# Patient Record
Sex: Female | Born: 1960 | Race: White | Hispanic: No | Marital: Married
Health system: Midwestern US, Community
[De-identification: ages and names within clinical notes are randomized; demographics above are authoritative.]

## PROBLEM LIST (undated history)

## (undated) DIAGNOSIS — C50112 Malignant neoplasm of central portion of left female breast: Principal | ICD-10-CM

## (undated) DIAGNOSIS — T451X5A Adverse effect of antineoplastic and immunosuppressive drugs, initial encounter: Secondary | ICD-10-CM

## (undated) DIAGNOSIS — C50012 Malignant neoplasm of nipple and areola, left female breast: Principal | ICD-10-CM

## (undated) DIAGNOSIS — R232 Flushing: Secondary | ICD-10-CM

---

## 2019-03-30 LAB — FECAL DNA COLORECTAL CANCER SCREENING (COLOGUARD): FIT-DNA (Cologuard): NEGATIVE

## 2019-04-05 LAB — LIPID PANEL
Chol/HDL Ratio: 3.2 (ref 0.0–4.4)
Cholesterol: 206 mg/dL — ABNORMAL HIGH (ref 100–200)
HDL: 64 mg/dL — ABNORMAL LOW (ref 65–96)
LDL Cholesterol: 97 mg/dL (ref 0.0–100.0)
LDL/HDL Ratio: 2
Triglycerides: 224 mg/dL — ABNORMAL HIGH (ref 0–149)
VLDL: 44.8 mg/dL — ABNORMAL HIGH (ref 5.0–40.0)

## 2019-04-05 LAB — MICROALBUMIN / CREATININE URINE RATIO
Creatinine, Ur: 57 mg/dL (ref 28.0–217.0)
Microalb, Ur: 0.5 mg/dL (ref 0.0–20.0)
Microalbumin Creatinine Ratio: 9 mg/g Cr (ref 0–30)

## 2019-04-05 LAB — TSH WITH REFLEX TO FT4: TSH: 16.6 mcIU/mL — ABNORMAL HIGH (ref 0.358–3.740)

## 2019-04-05 LAB — COMPREHENSIVE METABOLIC PANEL
ALT: 19 U/L (ref 0–33)
AST: 18 U/L (ref 0–32)
Albumin/Globulin Ratio: 2 mmol/L (ref 1.00–2.00)
Albumin: 4.6 g/dL (ref 3.5–5.2)
Alk Phosphatase: 90 U/L (ref 35–117)
Anion Gap: 11 mmol/L (ref 2–17)
BUN: 14 mg/dL (ref 6–20)
CO2: 27 mmol/L (ref 22–29)
Calcium: 9.5 mg/dL (ref 8.6–10.0)
Chloride: 102 mmol/L (ref 98–107)
Creatinine: 0.7 mg/dL (ref 0.5–0.9)
GFR African American: 111 mL/min/{1.73_m2} (ref >=90)
GFR Non-African American: 96 mL/min/{1.73_m2} (ref >=90)
Globulin: 3 g/dL (ref 1.9–4.4)
Glucose: 103 mg/dL — ABNORMAL HIGH (ref 70–99)
OSMOLALITY CALCULATED: 282 mOsm/kg (ref 270–287)
Potassium: 4.2 mmol/L (ref 3.5–5.3)
Sodium: 141 mmol/L (ref 135–145)
Total Bilirubin: 0.19 mg/dL (ref 0.00–1.20)
Total Protein: 7.2 g/dL (ref 6.4–8.3)

## 2019-04-05 LAB — HEMOGLOBIN A1C
Est. Avg. Glucose, WB: 120
Est. Avg. Glucose-calculated: 129
Hemoglobin A1C: 5.8 % (ref 4.0–6.0)

## 2019-04-05 LAB — CBC WITH AUTO DIFFERENTIAL
Absolute Baso #: 0 10*3/uL (ref 0.0–0.2)
Absolute Eos #: 0.2 10*3/uL (ref 0.0–0.5)
Absolute Lymph #: 1.8 10*3/uL (ref 1.0–3.2)
Absolute Mono #: 0.5 10*3/uL (ref 0.3–1.0)
Basophils %: 0.5 % (ref 0.0–2.0)
Eosinophils %: 4.1 % (ref 0.0–7.0)
Hematocrit: 41.4 % (ref 34.0–47.0)
Hemoglobin: 13.4 g/dL (ref 11.5–15.7)
Immature Grans (Abs): 0.03 10*3/uL (ref 0.00–0.06)
Immature Granulocytes: 0.5 % (ref 0.1–0.6)
Lymphocytes: 32.9 % (ref 15.0–45.0)
MCH: 30.5 pg (ref 27.0–34.5)
MCHC: 32.4 g/dL (ref 32.0–36.0)
MCV: 94.3 fL (ref 81.0–99.0)
MPV: 9.7 fL (ref 7.2–13.2)
Monocytes: 8.8 % (ref 4.0–12.0)
Neutrophils %: 53.2 % (ref 42.0–74.0)
Neutrophils Absolute: 3 10*3/uL (ref 1.6–7.3)
Platelets: 284 10*3/uL (ref 140–440)
RBC: 4.39 x10e6/mcL (ref 3.60–5.20)
RDW: 13.9 % (ref 11.0–16.0)
WBC: 5.6 10*3/uL (ref 3.8–10.6)

## 2019-04-05 LAB — T4, FREE: T4 Free: 1.1 ng/dL (ref 0.82–1.70)

## 2019-07-29 LAB — TSH WITH REFLEX TO FT4: TSH: 4.02 mcIU/mL — ABNORMAL HIGH (ref 0.358–3.740)

## 2019-07-29 LAB — T4, FREE: T4 Free: 1.48 ng/dL (ref 0.82–1.70)

## 2020-03-17 LAB — COMPREHENSIVE METABOLIC PANEL
ALT: 19 U/L (ref 0–33)
AST: 17 U/L (ref 0–32)
Albumin/Globulin Ratio: 2 mmol/L (ref 1.00–2.70)
Albumin: 4.9 g/dL (ref 3.5–5.2)
Alk Phosphatase: 95 U/L (ref 35–117)
Anion Gap: 10 mmol/L (ref 2–17)
BUN: 17 mg/dL (ref 6–20)
CO2: 27 mmol/L (ref 22–29)
Calcium: 9.4 mg/dL (ref 8.6–10.0)
Chloride: 101 mmol/L (ref 98–107)
Creatinine: 0.6 mg/dL (ref 0.5–1.0)
GFR African American: 116 mL/min/{1.73_m2} (ref >=90)
GFR Non-African American: 100 mL/min/{1.73_m2} (ref >=90)
Globulin: 2 g/dL (ref 1.9–4.4)
Glucose: 110 mg/dL — ABNORMAL HIGH (ref 70–99)
OSMOLALITY CALCULATED: 280 mOsm/kg (ref 270–287)
Potassium: 4.1 mmol/L (ref 3.5–5.3)
Sodium: 139 mmol/L (ref 135–145)
Total Bilirubin: 0.31 mg/dL (ref 0.00–1.20)
Total Protein: 7.4 g/dL (ref 6.4–8.3)

## 2020-03-17 LAB — CBC WITH AUTO DIFFERENTIAL
Absolute Baso #: 0 10*3/uL (ref 0.0–0.2)
Absolute Eos #: 0.2 10*3/uL (ref 0.0–0.5)
Absolute Lymph #: 2 10*3/uL (ref 1.0–3.2)
Absolute Mono #: 0.6 10*3/uL (ref 0.3–1.0)
Basophils %: 0.7 % (ref 0.0–2.0)
Eosinophils %: 3.7 % (ref 0.0–7.0)
Hematocrit: 43.2 % (ref 34.0–47.0)
Hemoglobin: 14.2 g/dL (ref 11.5–15.7)
Immature Grans (Abs): 0.01 10*3/uL (ref 0.00–0.06)
Immature Granulocytes: 0.2 % (ref 0.1–0.6)
Lymphocytes: 37.9 % (ref 15.0–45.0)
MCH: 31.1 pg (ref 27.0–34.5)
MCHC: 32.9 g/dL (ref 32.0–36.0)
MCV: 94.7 fL (ref 81.0–99.0)
MPV: 9.6 fL (ref 7.2–13.2)
Monocytes: 10.2 % (ref 4.0–12.0)
Neutrophils %: 47.3 % (ref 42.0–74.0)
Neutrophils Absolute: 2.5 10*3/uL (ref 1.6–7.3)
Platelets: 288 10*3/uL (ref 140–440)
RBC: 4.56 x10e6/mcL (ref 3.60–5.20)
RDW: 12.8 % (ref 11.0–16.0)
WBC: 5.4 10*3/uL (ref 3.8–10.6)

## 2020-03-17 LAB — LIPID PANEL
Chol/HDL Ratio: 3.3 (ref 0.0–4.4)
Cholesterol: 186 mg/dL (ref 100–200)
HDL: 56 mg/dL (ref >=50)
LDL Cholesterol: 93 mg/dL (ref 0.0–100.0)
LDL/HDL Ratio: 2
Triglycerides: 186 mg/dL — ABNORMAL HIGH (ref 0–149)
VLDL: 37.2 mg/dL (ref 5.0–40.0)

## 2020-03-17 LAB — TSH WITH REFLEX TO FT4: TSH: 1.22 mcIU/mL (ref 0.358–3.740)

## 2020-03-17 LAB — HEMOGLOBIN A1C
Est. Avg. Glucose, WB: 120
Est. Avg. Glucose-calculated: 129
Hemoglobin A1C: 5.8 % (ref 4.0–6.0)

## 2020-04-08 NOTE — Procedures (Signed)
Mammo procedure [facility]        Patient:   Robin Bell, Robin Bell             MRN: [id number]            FIN: [id number]               Age:   59 years     Sex:  Female     DOB:  [birth year]   Associated Diagnoses:   None   Author:   Carlena Bjornstad ANNE-MD          Imaging procedure and order verification safety checklist completed: Yes  Allergies documented and available in allergy section of the EMR.    Procedure performed:  Site:   Breast: Right  xLeft   [ ]  Cyst aspiration   [ ]  Wire localization   [ x] US biopsy    [ ]  Stereotactic biopsy   [ ]  MRI biopsy    Procedure and Findings:   [x ] Mass   [ ]  Calcification   [ ]  Clip   [ ]  Other    Puncture Site:   [ ]  Medial   [ x] Lateral   [ ]  Superior   [ ]  Inferior    Contrast:   [ x] None.     [ ]  Other    Medications:   [ x]   15 cc 1% Lidocaine SQ for local   [ ]  with epinephrine    Specimens Removed: None unless noted,   [x ] Core samples   [ ]  Cyst fluid   [ ]  Other    Estimated Blood Loss: None unless noted.    Postoperative Diagnosis:   [ ]  Cyst aspiration performed   [ ]  Stereotactic biopsy performed   [ ]  Wire localization performed   [ x] US biopsy performed   [ ]  MRI biopsy performed    Disposition:   [ x] Home   [ ]  Surgery department   [ ]  Nurse holding   [ ]  Other:    Comments: None unless noted.      Signature Line     Electronically Signed on 04/08/2020 11:08 AM EDT   ________________________________________________   Carlena Bjornstad ANNE-MD

## 2020-04-28 LAB — COMPREHENSIVE METABOLIC PANEL
ALT: 18 U/L (ref 0–33)
AST: 19 U/L (ref 0–32)
Albumin/Globulin Ratio: 2 mmol/L (ref 1.00–2.70)
Albumin: 4.5 g/dL (ref 3.5–5.2)
Alk Phosphatase: 81 U/L (ref 35–117)
Anion Gap: 12 mmol/L (ref 2–17)
BUN: 16 mg/dL (ref 6–20)
CO2: 27 mmol/L (ref 22–29)
Calcium: 9.4 mg/dL (ref 8.6–10.0)
Chloride: 104 mmol/L (ref 98–107)
Creatinine: 0.7 mg/dL (ref 0.5–1.0)
GFR African American: 111 mL/min/{1.73_m2} (ref >=90)
GFR Non-African American: 96 mL/min/{1.73_m2} (ref >=90)
Globulin: 2 g/dL (ref 1.9–4.4)
Glucose: 105 mg/dL — ABNORMAL HIGH (ref 70–99)
OSMOLALITY CALCULATED: 287 mOsm/kg (ref 270–287)
Potassium: 4 mmol/L (ref 3.5–5.3)
Sodium: 143 mmol/L (ref 135–145)
Total Bilirubin: 0.2 mg/dL (ref 0.00–1.20)
Total Protein: 6.7 g/dL (ref 6.4–8.3)

## 2020-05-08 NOTE — Procedures (Signed)
Breast Imaging - Left Ultrasound Biopsy        Patient:   Robin Bell, Robin Bell             MRN: [id number]            FIN: [id number]               Age:   59 years     Sex:  Female     DOB:  1960/11/19   Associated Diagnoses:   None   Author:   Lindell Noe          Imaging procedure and order verification safety checklist completed: Yes  Allergies documented and available in allergy section of the EMR.    Procedure performed:   Left axilla ultrasound guided biopsy    Target:   [x]  Node   [ ]  Cyst    Puncture Site:   [ ]  Medial   [ ]  Lateral   [x]  Superior   [ ]  Inferior    Medications:   [x]  1% Lidocaine subcutaneous   [x]  1% Lidocaine with epinephrine    Specimens:   [x]  Core samples   [ ]  Cyst fluid    Estimated Blood Loss:   None.    Postoperative Diagnosis:   [ ]  Cyst aspiration performed   [x]  [place] core biopsy performed      Disposition:  Home    Comments: Savi placed.      Signature Line     Electronically Signed on 05/08/2020 02:30 PM EDT   ________________________________________________   Lovett Sox, MD, Nada Boozer

## 2020-05-15 NOTE — Nursing Note (Signed)
Adult Admission Assessment - Text       Perioperative Admission Assessment Entered On:  05/15/2020 10:05 EDT    Performed On:  05/15/2020 9:47 EDT by Mendel Ryder, RN, [place]   Call Start :   05/15/2020 9:47 EDT   Mendel Ryder, RN, [place] - 05/15/2020 9:47 EDT   Call Complete :   05/15/2020 10:13 EDT   Mendel Ryder, RN, [place]AC[place] - 05/15/2020 10:12 EDT     Information Given By :   Self   Height/Length Estimated :   162.6 cm(Converted to: 64.02 in)    Weight   Estimated :   91.81 kg(Converted to: 202.406 lb)    Body Mass Index Estimated :   34.73 kg/m2   PAT Patient Procedure Verification :   Patient name and DOB confirmed with patient, Correct procedure scheduled confirmed with patient, Correct side/site confirmed with patient   Primary Care Physician/Specialists :   DR. Carola Rhine   Day of Proc Supp Prsn is the Emerg Cont :   Yes   Day of Procedure Support Person Name :   Wyatt Mage   Day of Procedure Support Person Phone :   [phone]   Day of Procedure Support Person Relationship :   SPOUSE   Languages :   Morrie Sheldon, RN, [place] - 05/15/2020 9:47 EDT   Allergies   (As Of: 05/15/2020 10:13:38 EDT)   Allergies (Active)   Adhesive Bandage  Estimated Onset Date:   Unspecified ; Reactions:   IRRITATES SKIN ; Created By:   Mendel Ryder RN, Hardie Pulley; Reaction Status:   Active ; Category:   Drug ; Substance:   Adhesive Bandage ; Type:   Allergy ; Severity:   Moderate ; Updated By:   Kem Boroughs; Reviewed Date:   05/15/2020 10:13 EDT      penicillins  Estimated Onset Date:   Unspecified ; Reactions:   RASH, SWELLING ; Created By:   Mendel Ryder RN, STACI; Reaction Status:   Active ; Category:   Drug ; Substance:   penicillins ; Type:   Allergy ; Severity:   Moderate ; Updated By:   Kem Boroughs; Reviewed Date:   05/15/2020 10:13 EDT        Medication History   Medication List   (As Of: 05/15/2020 10:13:38 EDT)   Home Meds    levothyroxine  :   levothyroxine ; Status:   Documented ; Ordered As Mnemonic:   Synthroid 125  mcg (0.125 mg) oral tablet ; Simple Display Line:   125 mcg, 1 tabs, Oral, Daily, 0 Refill(s) ; Catalog Code:   levothyroxine ; Order Dt/Tm:   05/15/2020 09:50:46 EDT          rosuvastatin  :   rosuvastatin ; Status:   Documented ; Ordered As Mnemonic:   rosuvastatin 10 mg oral tablet ; Simple Display Line:   10 mg, 1 tabs, Oral, Daily, 0 Refill(s) ; Catalog Code:   rosuvastatin ; Order Dt/Tm:   05/15/2020 09:50:46 EDT          losartan  :   losartan ; Status:   Documented ; Ordered As Mnemonic:   losartan 25 mg oral tablet ; Simple Display Line:   25 mg, 1 tabs, Oral, Daily, 30 tabs, 0 Refill(s) ; Catalog Code:   losartan ; Order Dt/Tm:   05/15/2020 09:50:46 EDT          propranolol  :  propranolol ; Status:   Documented ; Ordered As Mnemonic:   propranolol 60 mg oral capsule, extended release ; Simple Display Line:   60 mg, 1 cap, Oral, Daily, 30 cap, 0 Refill(s) ; Catalog Code:   propranolol ; Order Dt/Tm:   05/15/2020 09:50:46 EDT          acetaminophen  :   acetaminophen ; Status:   Documented ; Ordered As Mnemonic:   Tylenol ; Simple Display Line:   325 mg, Oral, q4hr, PRN: mild pain (1-3), 0 Refill(s) ; Catalog Code:   acetaminophen ; Order Dt/Tm:   05/15/2020 09:50:28 EDT            Problem History   (As Of: 05/15/2020 10:05:19 EDT)   Problems(Active)    Gloriann Loan palsy (SNOMED CT  :735329924 )  Name of Problem:   Bell palsy ; Onset Date:   46 ; Recorder:   LINDSEY, RN, [place]; Confirmation:   Confirmed ; Classification:   Patient Stated ; Code:   [id number] ; Contributor System:   Conservation officer, nature ; Last Updated:   05/15/2020 9:51 EDT ; Life Cycle Status:   Active ; Vocabulary:   SNOMED CT   ; Comments:        05/15/2020 9:51 - Mendel Ryder, RN, STACI  AND AGAIN IN 2015      Hypertension (SNOMED CT  :2297989211 )  Name of Problem:   Hypertension ; Recorder:   Mendel Ryder, RN, [place]; Confirmation:   Confirmed ; Classification:   Patient Stated ; Code:   [id number] ; Contributor System:   Conservation officer, nature ; Last Updated:   05/15/2020 9:52 EDT  ; Life Cycle Date:   05/15/2020 ; Life Cycle Status:   Active ; Vocabulary:   SNOMED CT        Hypothyroid (SNOMED CT  :81856314 )  Name of Problem:   Hypothyroid ; Recorder:   Mendel Ryder, RN, [place]; Confirmation:   Confirmed ; Classification:   Patient Stated ; Code:   [id number] ; Contributor System:   Conservation officer, nature ; Last Updated:   05/15/2020 9:51 EDT ; Life Cycle Date:   05/15/2020 ; Life Cycle Status:   Active ; Vocabulary:   SNOMED CT        Malignant brain tumor (SNOMED CT  :5885027741 )  Name of Problem:   Malignant brain tumor ; Onset Date:   69 ; Recorder:   LINDSEY, RN, [place]; Confirmation:   Confirmed ; Classification:   Patient Stated ; Code:   [id number] ; Contributor System:   PowerChart ; Last Updated:   05/15/2020 9:52 EDT ; Life Cycle Status:   Active ; Vocabulary:   SNOMED CT        Menopause (SNOMED CT  :947096283 )  Name of Problem:   Menopause ; Recorder:   LINDSEY, RN, [place]; Confirmation:   Confirmed ; Classification:   Patient Stated ; Code:   [id number] ; Contributor System:   Conservation officer, nature ; Last Updated:   05/15/2020 10:04 EDT ; Life Cycle Date:   05/15/2020 ; Life Cycle Status:   Active ; Vocabulary:   SNOMED CT        Prediabetes (SNOMED CT  :6503546568 )  Name of Problem:   Prediabetes ; Recorder:   Mendel Ryder, RN, [place]; Confirmation:   Confirmed ; Classification:   Patient Stated ; Code:   [id number] ; Contributor System:   PowerChart ; Last Updated:   05/15/2020 9:51 EDT ; Life Cycle Date:   05/15/2020 ; Life Cycle Status:   Active ;  Vocabulary:   SNOMED CT        Squamous cell carcinoma, 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 )  Name of Problem:   Squamous cell carcinoma, face ; Recorder:   Mendel Ryder, RN, [place]; Confirmation:   Confirmed ; Classification:   Patient Stated ; Code:   [id number] ; Contributor System:   PowerChart ; Last Updated:   05/15/2020 10:03 EDT ; Life Cycle Status:   Active ; Vocabulary:   SNOMED CT   ; Comments:        05/15/2020 10:03 - Mendel Ryder, RN, [place]  FRONT OF NECK        Procedure  History        -    Procedure History   (As Of: 05/15/2020 10:05:19 EDT)     Procedure Dt/Tm:   3086 ; Anesthesia Minutes:   0 ; Procedure Name:   Lumpectomy of right breast ; Procedure Minutes:   0 ; Last Reviewed Dt/Tm:   05/15/2020 10:04:16 EDT            Procedure Dt/Tm:   5784 ; Anesthesia Minutes:   0 ; Procedure Name:   Craniotomy ; Procedure Minutes:   0 ; Last Reviewed Dt/Tm:   05/15/2020 10:04:16 EDT            Anesthesia/Sedation   Anesthesia History :   Prior general anesthesia   SN - Malignant Hyperthermia :   Denies   Previous Problem with Anesthesia :   None   Moderate Sedation History :   No prior sedation for procedure   Symptoms of Sleep Apnea :   Age greater than 50, Hypertension   Symptoms of Sleep Apnea Score :   2    LINDSEY, RN, [place] - 05/15/2020 9:47 EDT   Bloodless Medicine   Is Blood Transfusion Acceptable to Patient :   Yes   LINDSEY, RN, [place] - 05/15/2020 9:47 EDT   ID Risk Screen Symptoms   Recent Travel History :   No recent travel   Close Contact with COVID-19 ID :   Preadmission testing patients only   Last 14 days COVID-19 ID :   No   TB Symptom Screen :   No symptoms   C. diff Symptom/History ID :   Neither of the above   LINDSEY, RN, [place] - 05/15/2020 9:47 EDT   ID COVID-19 Screen   Fever OR Chills :   No   Headache :   No   New or Worsening Cough :   No   Fatigue :   No   Shortness of Breath ID :   No   Myalgia (Muscle Pain) :   No   Dyspnea :   No   Diarrhea :   No   Sore Throat :   No   Nausea :   No   Laryngitis :   No   Sudden Loss of Taste or Smell :   No   Mendel Ryder, RN, [place] - 05/15/2020 9:47 EDT   Social History   Social History   (As Of: 05/15/2020 10:05:19 EDT)   Tobacco:        Tobacco use: Former smoker, quit more than 30 days ago.   Comments:  05/15/2020 9:54 - Mendel Ryder, RN, [place]: STOPPED 2005   (Last Updated: 05/15/2020 09:54:36 EDT by Mendel Ryder, RN, [place])          Electronic Cigarette/Vaping:        Never Electronic Cigarette Use.   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

## 2020-05-16 LAB — COVID-19 (COBAS): SARS-CoV-2, NAA: NEGATIVE

## 2020-05-19 NOTE — Nursing Note (Signed)
Adult Admission Assessment - Text       Perioperative Admission Assessment Entered On:  05/19/2020 12:46 EST    Performed On:  05/19/2020 12:38 EST by [place],  [place]   Call Start :   05/15/2020 9:47 EDT   Call Complete :   05/15/2020 10:13 EDT   Information Given By :   Self   Height/Length Estimated :   162.6 cm(Converted to: 64.02 in)    Weight   Estimated :   91.81 kg(Converted to: 202.406 lb)    Body Mass Index Estimated :   34.73 kg/m2   Primary Care Physician/Specialists :   DR. Carola Rhine   Day of Proc Supp Prsn is the Emerg Cont :   Yes   Day of Procedure Support Person Name :   Clair Gulling   Day of Procedure Support Person Phone :   [phone]   Day of Procedure Support Person Relationship :   husand   Languages :   Seabron Spates - 05/19/2020 12:38 EST   Allergies   (As Of: 05/19/2020 12:46:23 EST)   Allergies (Active)   Adhesive Bandage  Estimated Onset Date:   Unspecified ; Reactions:   IRRITATES SKIN ; Created By:   Mendel Ryder RN, Hardie Pulley; Reaction Status:   Active ; Category:   Drug ; Substance:   Adhesive Bandage ; Type:   Allergy ; Severity:   Moderate ; Updated By:   Kem Boroughs; Reviewed Date:   05/19/2020 12:39 EST      penicillins  Estimated Onset Date:   Unspecified ; Reactions:   RASH, SWELLING ; Created By:   Mendel Ryder RN, STACI; Reaction Status:   Active ; Category:   Drug ; Substance:   penicillins ; Type:   Allergy ; Severity:   Moderate ; Updated By:   Kem Boroughs; Reviewed Date:   05/19/2020 12:39 EST        Medication History   Medication List   (As Of: 05/19/2020 12:46:23 EST)   Normal Order    Lactated Ringers Injection solution 1,000 mL  :   Lactated Ringers Injection solution 1,000 mL ; Status:   Ordered ; Ordered As Mnemonic:   Lactated Ringers Injection 1,000 mL ; Simple Display Line:   10 mL/hr, IV ; Ordering Provider:   Evon Slack A-MD; Catalog Code:   Lactated Ringers Injection ; Order Dt/Tm:   05/19/2020 12:18:00 EST ; Comment:    Perioperative use ONLY  For Non Dialysis Patient          sodium chloride 0.9% Inj Soln 10 mL syringe  :   sodium chloride 0.9% Inj Soln 10 mL syringe ; Status:   Ordered ; Ordered As Mnemonic:   sodium chloride 0.9% flush syringe range dose ; Simple Display Line:   30 mL, IV Push, q8hr ; Ordering Provider:   Marvene Staff C-MD; Catalog Code:   sodium chloride flush ; Order Dt/Tm:   05/19/2020 12:18:56 EST          NP TC-62M FILT COLLOID PER DOSE  :   NP TC-62M FILT COLLOID PER DOSE ; Status:   Completed ; Ordered As Mnemonic:   NP TC-62M FILT COLLOID PER DOSE ; Simple Display Line:   540 EA, Subcutaneous, Once ; Ordering Provider:   Michelene Gardener JR-MD; Catalog Code:   NP TC-62M FILT COLLOID PER DOSE ;  Order Dt/Tm:   05/19/2020 12:32:22 EST          A Patient Specific Medication  :   A Patient Specific Medication ; Status:   Ordered ; Ordered As Mnemonic:   A Patient Specific Medication ; Simple Display Line:   1 EA, Kit-Combo, q63mn, PRN: other (see comment) ; Ordering Provider:   HMarvene StaffC-MD; Catalog Code:   A Patient Specific Medication ; Order Dt/Tm:   05/19/2020 12:18:56 EST          A Patient Specific Refrigerated Medication  :   A Patient Specific Refrigerated Medication ; Status:   Ordered ; Ordered As Mnemonic:   A Patient Specific Refrigerated Medication ; Simple Display Line:   1 EA, Kit-Combo, q551m, PRN: other (see comment) ; Ordering Provider:   HEMarvene Staff-MD; Catalog Code:   A Patient Specific Refrigerated Medicati ; Order Dt/Tm:   05/19/2020 12:18:56 EST ; Comment:   to access the patient specific Refrigerated medications          clindamycin in D5W  :   clindamycin in D5W ; Status:   Ordered ; Ordered As Mnemonic:   clindamycin IVPB ; Simple Display Line:   900 mg, 50 mL, 100 mL/hr, IV Piggyback, On Call ; Ordering Provider:   ALEvon Slack-MD; Catalog Code:   clindamycin ; Order Dt/Tm:   05/19/2020 12:18:00 EST          Delivery and Return Bin Access  :   Delivery and Return [employer] ; Status:   Ordered ; Ordered As Mnemonic:   Delivery and Return [employer] Simple Display Line:   1 EA, Kit-Combo, q5m58m PRN: other (see comment) ; Ordering Provider:   HEBMarvene StaffMD; Catalog Code:   Delivery and Return Bin Access ; Order Dt/Tm:   05/19/2020 12:18:56 EST ; Comment:   This code grants access to the [employer] the Delivery and Return Bin Access          lidocaine 1% PF Inj Soln 2 mL  :   lidocaine 1% PF Inj Soln 2 mL ; Status:   Ordered ; Ordered As Mnemonic:   lidocaine 1% preservative-free injectable solution ; Simple Display Line:   0.25 mL, ID, q5mi11mPRN: other (see comment) ; Ordering Provider:   HEBBMarvene StaffD; Catalog Code:   lidocaine ; Order Dt/Tm:   05/19/2020 12:18:56 EST ; Comment:   to access lidocaine 1%  2 mL vial for IV start and Life Port access          Respiratory MDI Treatment  :   Respiratory MDI Treatment ; Status:   Ordered ; Ordered As Mnemonic:   Respiratory MDI Treatment ; Simple Display Line:   1 EA, Kit-Combo, q5min81mRN: other (see comment) ; Ordering Provider:   HEBBAMarvene Staff; Catalog Code:   Respiratory MDI Treatment ; Order Dt/Tm:   05/19/2020 12:18:56 EST          sodium chloride 0.9% Inj Soln 10 mL syringe  :   sodium chloride 0.9% Inj Soln 10 mL syringe ; Status:   Ordered ; Ordered As Mnemonic:   sodium chloride 0.9% flush syringe range dose ; Simple Display Line:   30 mL, IV Push, q5min,80mN: other (see comment) ; Ordering Provider:   HEBBARMarvene Staff Catalog Code:   sodium chloride flush ; Order Dt/Tm:   05/19/2020 12:18:56 EST  sodium chloride 0.9% Inj Soln 10 mL vial PF  :   sodium chloride 0.9% Inj Soln 10 mL vial PF ; Status:   Ordered ; Ordered As Mnemonic:   sodium chloride 0.9% vial for reconstitution range dose ; Simple Display Line:   30 mL, IV Push, q56mn, PRN: other (see comment) ; Ordering Provider:   HMarvene StaffC-MD; Catalog Code:   sodium chloride flush ; Order Dt/Tm:   05/19/2020 12:18:56  EST ; Comment:   for access to sodium chloride 0.9% vial when needed as a diluent for reconstitutable medications          sterile water Inj Soln 10 mL  :   sterile water Inj Soln 10 mL ; Status:   Ordered ; Ordered As Mnemonic:   sterile water for reconstitution ; Simple Display Line:   10 mL, N/A, q522m, PRN: other (see comment) ; Ordering Provider:   HEMarvene Staff-MD; Catalog Code:   sterile water for reconstitution ; Order Dt/Tm:   05/19/2020 12:18:56 EST ; Comment:   Access sterile water when needed as a diluent for reconstitutable medications. Not for IV use.            Home Meds    acetaminophen  :   acetaminophen ; Status:   Documented ; Ordered As Mnemonic:   Tylenol ; Simple Display Line:   325 mg, Oral, q4hr, PRN: mild pain (1-3), 0 Refill(s) ; Catalog Code:   acetaminophen ; Order Dt/Tm:   05/15/2020 09:50:28 EDT          levothyroxine  :   levothyroxine ; Status:   Documented ; Ordered As Mnemonic:   Synthroid 125 mcg (0.125 mg) oral tablet ; Simple Display Line:   125 mcg, 1 tabs, Oral, Daily, 0 Refill(s) ; Catalog Code:   levothyroxine ; Order Dt/Tm:   05/15/2020 09:50:46 EDT          losartan  :   losartan ; Status:   Documented ; Ordered As Mnemonic:   losartan 25 mg oral tablet ; Simple Display Line:   25 mg, 1 tabs, Oral, Daily, 30 tabs, 0 Refill(s) ; Catalog Code:   losartan ; Order Dt/Tm:   05/15/2020 09:50:46 EDT          propranolol  :   propranolol ; Status:   Documented ; Ordered As Mnemonic:   propranolol 60 mg oral capsule, extended release ; Simple Display Line:   60 mg, 1 cap, Oral, Daily, 30 cap, 0 Refill(s) ; Catalog Code:   propranolol ; Order Dt/Tm:   05/15/2020 09:50:46 EDT          rosuvastatin  :   rosuvastatin ; Status:   Documented ; Ordered As Mnemonic:   rosuvastatin 10 mg oral tablet ; Simple Display Line:   10 mg, 1 tabs, Oral, Daily, 0 Refill(s) ; Catalog Code:   rosuvastatin ; Order Dt/Tm:   05/15/2020 09:50:46 EDT            Problem History   (As Of: 05/19/2020 12:46:23 EST)    Problems(Active)    BeGloriann Loanalsy (SNOMED CT  :29540086761  Name of Problem:   Bell palsy ; Onset Date:   1972 Recorder:   LINDSEY, RN, [place]an[place]ation:   Confirmed ; Classification:   Patient Stated ; Code:   [id number] Contributor System:   PoConservation officer, nature Last Updated:   05/15/2020 9:51 EDT ; Life Cycle Status:   Active ; Vocabulary:  SNOMED CT   ; Comments:        05/15/2020 9:51 - Mendel Ryder, RN, STACI  AND AGAIN IN 2015      Hypertension (SNOMED CT  :0175102585 )  Name of Problem:   Hypertension ; Recorder:   Mendel Ryder, RN, [place]; Confirmation:   Confirmed ; Classification:   Patient Stated ; Code:   [id number] ; Contributor System:   Conservation officer, nature ; Last Updated:   05/15/2020 9:52 EDT ; Life Cycle Date:   05/15/2020 ; Life Cycle Status:   Active ; Vocabulary:   SNOMED CT        Hypothyroid (SNOMED CT  :61443154 )  Name of Problem:   Hypothyroid ; Recorder:   Mendel Ryder, RN, [place]; Confirmation:   Confirmed ; Classification:   Patient Stated ; Code:   [id number] ; Contributor System:   Conservation officer, nature ; Last Updated:   05/15/2020 9:51 EDT ; Life Cycle Date:   05/15/2020 ; Life Cycle Status:   Active ; Vocabulary:   SNOMED CT        Malignant brain tumor (SNOMED CT  :5093267124 )  Name of Problem:   Malignant brain tumor ; Onset Date:   32 ; Recorder:   LINDSEY, RN, [place]; Confirmation:   Confirmed ; Classification:   Patient Stated ; Code:   [id number] ; Contributor System:   PowerChart ; Last Updated:   05/15/2020 9:52 EDT ; Life Cycle Status:   Active ; Vocabulary:   SNOMED CT        Menopause (SNOMED CT  :505397673 )  Name of Problem:   Menopause ; Recorder:   LINDSEY, RN, [place]; Confirmation:   Confirmed ; Classification:   Patient Stated ; Code:   [id number] ; Contributor System:   Conservation officer, nature ; Last Updated:   05/15/2020 10:04 EDT ; Life Cycle Date:   05/15/2020 ; Life Cycle Status:   Active ; Vocabulary:   SNOMED CT        Prediabetes (SNOMED CT  :0973532992 )  Name of Problem:   Prediabetes ; Recorder:   Mendel Ryder, RN, [place];  Confirmation:   Confirmed ; Classification:   Patient Stated ; Code:   [id number] ; Contributor System:   PowerChart ; Last Updated:   05/15/2020 9:51 EDT ; Life Cycle Date:   05/15/2020 ; Life Cycle Status:   Active ; Vocabulary:   SNOMED CT        Squamous cell carcinoma, face (SNOMED CT  :2297989211 )  Name of Problem:   Squamous cell carcinoma, face ; Recorder:   Mendel Ryder, RN, [place]; Confirmation:   Confirmed ; Classification:   Patient Stated ; Code:   [id number] ; Contributor System:   PowerChart ; Last Updated:   05/15/2020 10:03 EDT ; Life Cycle Status:   Active ; Vocabulary:   SNOMED CT   ; Comments:        05/15/2020 10:03 - Mendel Ryder, RN, STACI  FRONT OF NECK        Procedure History        -    Procedure History   (As Of: 05/19/2020 12:46:23 EST)     Procedure Dt/Tm:   8185 ; Anesthesia Minutes:   0 ; Procedure Name:   Lumpectomy of right breast ; Procedure Minutes:   0 ; Last Reviewed Dt/Tm:   05/19/2020 12:44:59 EST            Procedure Dt/Tm:   6314 ; Anesthesia Minutes:   0 ; Procedure Name:   Craniotomy ; Procedure  Minutes:   0 ; Last Reviewed Dt/Tm:   05/19/2020 12:44:59 EST            History Confirmation   Problem History Changes PAT :   No   Procedure History Changes PAT :   No   Lujean Amel - 05/19/2020 12:38 EST   Anesthesia/Sedation   Anesthesia History :   Prior general anesthesia   SN - Malignant Hyperthermia :   Denies   Moderate Sedation History :   Prior sedation for procedure   Previous Problem With Sedation :   None   Shortness of Breath Indicator :   No shortness of breath   Lujean Amel - 05/19/2020 12:38 EST   Bloodless Medicine   Is Blood Transfusion Acceptable to Patient :   Yes   Lujean Amel - 05/19/2020 12:38 EST   ID Risk Screen Symptoms   Recent Travel History :   No recent travel   Close Contact with COVID-19 ID :   Preadmission testing patients only   Last 14 days COVID-19 ID :   No   TB Symptom Screen :   No symptoms   C. diff Symptom/History ID :   Neither  of the above   Lujean Amel - 05/19/2020 12:38 EST   ID COVID-19 Screen   Fever OR Chills :   No   Headache :   No   New or Worsening Cough :   No   Fatigue :   No   Shortness of Breath ID :   No   Myalgia (Muscle Pain) :   No   Dyspnea :   No   Diarrhea :   No   Sore Throat :   No   Nausea :   No   Laryngitis :   No   Sudden Loss of Taste or Smell :   No   Lujean Amel - 05/19/2020 12:38 EST   Social History   Social History   (As Of: 05/19/2020 12:46:23 EST)   Tobacco:        Tobacco use: Former smoker, quit more than 30 days ago.   Comments:  05/15/2020 9:54 - Mendel Ryder, RN, STACI: STOPPED 2005   (Last Updated: 05/15/2020 09:54:36 EDT by Mendel Ryder, RN, STACI)          Electronic Cigarette/Vaping:        Never Electronic Cigarette Use.   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

## 2020-05-19 NOTE — Anesthesia Pre-Procedure Evaluation (Signed)
Preanesthesia Evaluation        Patient:   Robin Bell, Robin Bell             MRN: [id number]            FIN: [id number]               Age:   59 years     Sex:  Female     DOB:  [birth year]   Associated Diagnoses:   None   Author:   Luanne Bras J-MD      Preoperative Information   NPO:  NPO greater than 8 hours.    Anesthesia history     Patient's history: negative.     Family's history: negative.        History of Present Illness   The patient presents for preanesthesia evaluation with diagnosis and symptoms as documented in full proceduralist history and physical.        Health Status   Allergies:    Allergic Reactions (Selected)  Moderate  Adhesive Bandage- Irritates skin.  Penicillins- Rash, swelling.,    Allergies    (Active and Proposed Allergies Only)  Adhesive Bandage   (Severity: Moderate, Onset: Unknown)   Reactions: IRRITATES SKIN  penicillins   (Severity: Moderate, Onset: Unknown)   Reactions: RASH, SWELLING     Current medications:    Home Medications (5) Active  losartan 25 mg oral tablet 25 mg = 1 tabs, Oral, Daily  propranolol 60 mg oral capsule, extended release 60 mg = 1 cap, Oral, Daily  rosuvastatin 10 mg oral tablet 10 mg = 1 tabs, Oral, Daily  Synthroid 125 mcg (0.125 mg) oral tablet 125 mcg = 1 tabs, Oral, Daily  Tylenol 325 mg, PRN, Oral, q4hr  ,    Medications (11) Active  Scheduled: (2)  clindamycin in D5W  900 mg 50 mL, IV Piggyback, On Call  sodium chloride 0.9% Inj Soln 10 mL syringe  30 mL, IV Push, q8hr  Continuous: (1)  Lactated Ringers Injection solution 1,000 mL  1,000 mL, IV, 10 mL/hr  PRN: (8)  A Patient Specific Medication  1 EA, Kit-Combo, q84mn  A Patient Specific Refrigerated Medication  1 EA, Kit-Combo, q56m  Delivery and Return Bin Access  1 EA, Kit-Combo, q5m75m lidocaine 1% PF Inj Soln 2 mL  0.25 mL, ID, q5mi26mRespiratory MDI Treatment  1 EA, Kit-Combo, q5min30modium chloride 0.9% Inj Soln 10 mL syringe  30 mL, IV Push, q5min 55mdium chloride 0.9% Inj Soln 10 mL vial PF  30  mL, IV Push, q5min  96mrile water Inj Soln 10 mL  10 mL, N/A, q5min   54mroblem list:    Active Problems (7)  Bell palsy   Hypertension   Hypothyroid   Malignant brain tumor   Menopause   Prediabetes   Squamous cell carcinoma, face   ,    Problems   (Active Problems Only)    Malignant neoplasm of brain   (SNOMED CT: 269206909417408144 12/05/88)  Bell's palsy   (SNOMED CT: 29743201818563149 12/06/87)   Comment:  AND AGAIN IN 2015  (LINDSEY,Mendel RyderACI on 05/15/20)   Hypothyroidism   (SNOMED CT: 6826801170263785 --)  Prediabetes   (SNOMED CT: 329903608850277412 --)  Hypertensive disorder   (SNOMED CT: 121574408786767209 --)  Squamous cell carcinoma of skin of face   (SNOMED CT: 178289604709628366 --)   Comment:  FRONT OF NECK  (LINDSEY, RN, STACI  on 05/15/20)   Menopause present   (SNOMED CT: 952841324, Onset: --)        Histories   Past Medical History:    No active or resolved past medical history items have been selected or recorded.   Procedure history:    Lumpectomy of right breast (4010272536) in 1997 at 48 Years.  Craniotomy (64403474) in 1990 at 24 Years.   Social History        Social & Psychosocial Habits    Alcohol  05/15/2020  Use: Current    Frequency: 1-2 times per week    Substance Use  05/15/2020  Opioid Assessment Opioid Naive-not taking    Use: Denies    Tobacco  05/15/2020  Use: Former smoker, quit more    Comment: STOPPED 2005 - 05/15/2020 09:54 - Mendel Ryder RN, STACI    Electronic Cigarette/Vaping  05/15/2020  Electronic Cigarette Use: Never  .     Symptoms of Sleep Apnea Score: PAT Documentation: 2                        05/15/2020 9:47 EDT     PONV Risk Score: PAT Documentation   3 05/19/2020 12:46 EST   3 05/15/2020 10:05 EDT         Physical Examination   Vital Signs   25/03/5637 75:64 EST Systolic Blood Pressure 332 mmHg  HI    Diastolic Blood Pressure 85 mmHg   95/07/8839 66:06 EST Systolic Blood Pressure 301 mmHg  >HHI    Diastolic Blood Pressure 85 mmHg    Temperature Oral 36.6 degC    Heart Rate  Monitored 56 bpm  LOW    Respiratory Rate 18 br/min    SpO2 97 %    SBP/DBP Cuff Details Right arm         Vital Signs (last 24 hrs)_____  Last Charted___________  Temp Oral     36.6 degC  (NOV 09 12:12)  Resp Rate         18 br/min  (NOV 09 12:12)  SBP      H 18mHg  (NOV 09 12:48)  DBP      85 mmHg  (NOV 09 12:48)  SpO2      97 %  (NOV 09 12:12)  Weight      92.0 kg  (NOV 09 12:12)  BMI      34.80  (NOV 09 12:13)     Measurements from flowsheet : Measurements   05/19/2020 12:38 EST Height/Length Estimated 162.6 cm    Weight Estimated 91.81 kg    Body Mass Index Estimated 34.73 kg/m2   05/19/2020 12:13 EST Body Mass Index est meas 34.80 kg/m2    Body Mass Index Measured 34.80 kg/m2   05/19/2020 12:12 EST Patient Stated Height/Length 162.6 cm    Weight Measured 92.0 kg    Weight Dosing 92.0 kg      Pain assessment:  Pain Assessment   05/19/2020 12:13 EST      Numeric Rating Pain Scale 0 = No pain     .    General:       Appearance: Obese.    Airway:          Mallampati classification: III (soft palate, base of uvula visible).         Thyromental Distance: Normal.         Mouth: Teeth ( Within normal limits ).    Head   Neck:  Full range  of motion.    Respiratory:  Lungs are clear to auscultation, Breath sounds are equal.    Cardiovascular:  Normal rate, Regular rhythm.    Gastrointestinal:  Deferred.    Musculoskeletal     Deferred.        Review / Management   Results review:     No qualifying data available.    Documentation reviewed:  Current records, Nurses notes.       Assessment and Plan   American Society of Anesthesiologists#(ASA) physical status classification:  Class III.    Anesthetic Preoperative Plan     Anesthetic technique: General anesthesia.     Maintenance airway: Laryngeal mask airway.     Risks discussed: nausea, vomiting, sore throat, dental injury, hypotension, allergic reaction, serious complications.     Signature Line     Electronically Signed on 05/19/2020 01:01 PM EST    ________________________________________________   Luanne Bras J-MD

## 2020-05-19 NOTE — Anesthesia Post-Procedure Evaluation (Signed)
Postanesthesia Evaluation        Patient:   Robin Bell, Robin Bell             MRN: [id number]            FIN: [id number]               Age:   59 years     Sex:  Female     DOB:  Dec 14, 1960   Associated Diagnoses:   None   Author:   Luanne Bras J-MD      Postoperative Information   Post Operative Info:          Post operative day: Post Anesthesia Care Unit.         Patient location: PACU.       Assessment   Postanesthesia assessment   Vitals: Vital Signs   00/03/3817 29:93 EST Systolic Blood Pressure 716 mmHg  HI    Diastolic Blood Pressure 99 mmHg  HI    Heart Rate Monitored 66 bpm    Respiratory Rate 17 br/min    SpO2 96 %   96/01/8937 10:17 EST Systolic Blood Pressure 510 mmHg  HI    Diastolic Blood Pressure 87 mmHg    Heart Rate Monitored 61 bpm    Respiratory Rate 12 br/min    SpO2 97 %   25/02/5276 82:42 EST Systolic Blood Pressure 353 mmHg  HI    Diastolic Blood Pressure 91 mmHg  HI    Temperature Oral 36.5 degC    Heart Rate Monitored 67 bpm    Respiratory Rate 14 br/min    SpO2 98 %   61/10/4313 40:08 EST Systolic Blood Pressure 676 mmHg  HI    Diastolic Blood Pressure 88 mmHg    Heart Rate Monitored 63 bpm    Respiratory Rate 12 br/min    SpO2 95 %   19/11/930 67:12 EST Systolic Blood Pressure 458 mmHg  HI    Diastolic Blood Pressure 85 mmHg    Heart Rate Monitored 64 bpm    Respiratory Rate 17 br/min    SpO2 96 %   03/19/8337 25:05 EST Systolic Blood Pressure 397 mmHg  HI    Diastolic Blood Pressure 92 mmHg  HI    Heart Rate Monitored 59 bpm  LOW    Respiratory Rate 13 br/min    SpO2 100 %   67/09/4191 79:02 EST Systolic Blood Pressure 409 mmHg  HI    Diastolic Blood Pressure 85 mmHg    Temperature Oral 36.6 degC    Heart Rate Monitored 61 bpm    Respiratory Rate 14 br/min    SpO2 99 %   73/11/3297 24:26 EST Systolic Blood Pressure 834 mmHg  HI    Diastolic Blood Pressure 85 mmHg   19/12/2227 79:89 EST Systolic Blood Pressure 211 mmHg  >HHI    Diastolic Blood Pressure 85 mmHg    Temperature Oral 36.6 degC    Heart  Rate Monitored 56 bpm  LOW    Respiratory Rate 18 br/min    SpO2 97 %    SBP/DBP Cuff Details Right arm      , Measurements from flowsheet : Measurements   05/19/2020 12:38 EST Height/Length Estimated 162.6 cm    Weight Estimated 91.81 kg    Body Mass Index Estimated 34.73 kg/m2   05/19/2020 12:13 EST Body Mass Index est meas 34.80 kg/m2    Body Mass Index Measured 34.80 kg/m2   05/19/2020 12:12 EST Patient Stated Height/Length 162.6  cm    Weight Measured 92.0 kg    Weight Dosing 92.0 kg      .     Respiratory function: Respiratory rate, airway, and oxygen saturation are at adequate levels.     Cardiovascular function: Heart Rate stable, Blood Pressure stable, Postoperative hydration status Adequate.     Mental status: appropriate for level of anesthesia.     Temperature: within normal limits.     Pain Control: Adequate.     Nausea/Vomiting: Absent.     Signature Line     Electronically Signed on 05/19/2020 05:30 PM EST   ________________________________________________   Luanne Bras J-MD

## 2020-05-19 NOTE — Procedures (Signed)
[facility] Record - [place] Record - [place] Summary                                                                   Primary Physician:        Evon Slack A-MD    Case Number:              [id number]    Finalized Date/Time:      06/01/20 15:01:27    Pt. Name:                 Robin Bell, Robin Bell [facility]    D.O.B./Sex:               13-Jan-1961    Female    Med Rec #:                [id number]    Physician:                Evon Slack A-MD    Financial #:              [id number]    Pt. Type:                 S    Room/Bed:                 /    Admit/Disch:              05/19/20 11:38:00 -                              05/19/20 17:29:00    Institution:       [facility] - Case Attendance                                                                                                    Entry 1                         Entry 2                         Entry 3                                          Case Attendee             Gena Fray,  PHILIP A-MD          Metts,  Sam J-RN                ORANGE, LPN, DELORIS L    Role Performed  Surgeon Primary                 Circulator                      Surgical Scrub    Time In                   05/19/20 14:16:00               05/19/20 14:16:00               05/19/20 14:16:00    Time Out     Procedure                 Breast Lumpectomy with          Breast Lumpectomy with          Breast Lumpectomy with                              Sentinel Node Bx(Left)          Sentinel Node Bx(Left)          Sentinel Node Bx(Left)    Last Modified By:         Alexis Goodell J-RN                Metts,  Sam J-RN                Metts,  Sam J-RN                              05/19/20 15:08:30               05/19/20 15:32:04               05/19/20 15:08:30                                Entry 4                         Entry 5                         Entry 6                                          Case Attendee             THOMAS,  BRADY W-CRNA           RUSSO,  ANTHONY J-MD             MARTIN,  [place]    Role Performed  CRNA                            Anesthesiologist                CRNA    Time In                   05/19/20 14:16:00               05/19/20 14:16:00               05/19/20 15:06:00    Time Out                  05/19/20 15:08:00                                               05/19/20 15:28:00    Procedure                 Breast Lumpectomy with          Breast Lumpectomy with          Breast Lumpectomy with                              Sentinel Node Bx(Left)          Sentinel Node Bx(Left)          Sentinel Node Bx(Left)    Last Modified By:         Alexis Goodell J-RN                Metts,  Sam J-RN                Metts,  Sam J-RN                              05/19/20 15:08:30               05/19/20 15:08:30               05/19/20 15:32:04                                Entry 7                                                                                                          Case Attendee             Alicia Amel W-CRNA    Role Performed            CRNA    Time In                   05/19/20 15:27:00    Time Out  Procedure                 Breast Lumpectomy with                              Sentinel Node Bx(Left)    Last Modified By:         Alexis Goodell J-RN                              05/19/20 15:32:04      [facility] - Case Attendance Audit                                                                     05/19/20 15:32:04         Owner: [id number]                               Modifier: [id number]                                                            2     <*> Procedure                              Breast Lumpectomy with Sentinel Node Bx(Left)            6     <+> Time Out            6     <*> Procedure                              Breast Lumpectomy with Sentinel Node Bx(Left)        <+> 7         Case Attendee        <+> 7         Role Performed        <+> 7          Time In        <+> 7         Procedure     05/19/20 15:08:30         Owner: [id number]                               Modifier: [id number]                                                            1     <+> Time In            1     <*>  Procedure                              Breast Lumpectomy with Sentinel Node Bx(Left)            2     <+> Time In            2     <*> Procedure                              Breast Lumpectomy with Sentinel Node Bx(Left)            3     <+> Time In            3     <*> Procedure                              Breast Lumpectomy with Sentinel Node Bx(Left)            4     <+> Time In            4     <+> Time Out            4     <*> Procedure                              Breast Lumpectomy with Sentinel Node Bx(Left)            5     <+> Time In            5     <*> Procedure                              Breast Lumpectomy with Sentinel Node Bx(Left)        <+> 6         Case Attendee        <+> 6         Role Performed        <+> 6         Time In        <+> 6         Procedure     05/19/20 14:38:28         Owner: [id number]                               Modifier: [id number]                                                        <+> 2         Case Attendee        <+> 2         Role Performed        <+> 2         Procedure        <+> 3         Case Attendee        <+> 3  Role Performed        <+> 3         Procedure        <+> 4         Case Attendee        <+> 4         Role Performed        <+> 4         Procedure        <+> 5         Case Attendee        <+> 5         Role Performed        <+> 5         Procedure        [facility] - Case Times                                                                                                         Entry 1                                                                                                          Patient      In Room Time             05/19/20 14:16:00               Out Room Time                   05/19/20 15:45:00    Anesthesia      Procedure      Start Time               05/19/20 14:38:00               Stop Time                       05/19/20 15:42:00    Last Modified By:         Alexis Goodell J-RN                              05/19/20 15:50:19      [facility] - Case Times Audit  05/19/20 15:50:19         Owner: [id number]                               Modifier: [id number]                                                        <+> 1         Out Room Time     05/19/20 15:44:46         Owner: [id number]                               Modifier: [id number]                                                            1     <*> Stop Time                              05/19/20 15:42:00     05/19/20 15:44:34         Owner: [id number]                               Modifier: [id number]                                                        <+> 1         Stop Time        [facility] - General Case Data                                                                                                  Entry 1                                                                                                          Case Information      ASA Class  3                               Case Level                      Level 3     OR                       BH 03                           Specialty                       General (SN)     Wound Class              1-Clean    Preop Diagnosis           C50.912, Z17.0                  Postop Diagnosis                C50.912, Z17.0    Last Modified By:         Alexis Goodell J-RN                              05/19/20 14:38:45      [facility] - Procedures                                                                                                         Entry 1                                                                                                          Procedure     Description      Procedure                Breast Lumpectomy with          Modifiers                       Left                               Sentinel Node Bx with  or without Axillary                              Dissection     Surgical Procedure       LEFT CENTRAL     Text                     LUMPECTOMY, SNBX    Primary Procedure         Yes                             Primary Surgeon                 Evon Slack A-MD    Start                     05/19/20 14:38:00               Stop                            05/19/20 15:42:00    Anesthesia Type           General                         Surgical Service                General (SN)    Wound Class               1-Clean    Last Modified ByAlexis Goodell J-RN                              05/19/20 15:44:52      [facility] - Procedures Audit                                                                          05/19/20 15:44:52         Owner: [id number]                               Modifier: [id number]                                                            1     <*> Procedure                              Breast Lumpectomy with Sentinel Node Bx with or without Axillary  Dissection            1     <*> Stop                                   05/19/20 15:42:00     05/19/20 15:44:35         Owner: [id number]                               Modifier: [id number]                                                        <+> 1         Stop        [facility] - Cautery                                                                                                            Entry 1                                                                                                          ESU Type                  GENERATOR                       Identification                  [id number]                              COVIDIEN/[facility]              Number     Coag Setting (watts)      35                              Cut Setting (watts)             35    Grounding Pad             Yes  Grounding Pad Site               Thigh, left    Needed?     Grounding Pad             Metts,  Sam J-RN                Outcome Met (O.10)              Yes    Applied By     Last Modified By:         Alexis Goodell J-RN                              05/19/20 14:39:45    General Comments:            grounding pad lot# [id number] T exp 02-07-2022      [facility] - Counts Initial and Final                                                                                           Entry 1                                                                                                          Initial Counts      Initial Counts           Metts,  Sam J-RN,               Items included in               Sponges, Sharps,     Performed By             [employer], LPN, DELORIS L          the Initial Count               Other/See Comments    Final Counts      Final Counts             Metts,  Sam J-RN,               Final Count Status              Correct     Performed By             ORANGE, LPN, DELORIS L     Items Included in        Sponges, Sharps,     Final Count              Other/See Comments    Outcome Met (O.20)        Yes  Last Modified By:         Alexis Goodell J-RN                              05/19/20 15:32:21      [place] - Counts Initial and Final Audit                                                            05/19/20 15:32:21         Owner: [id number]                               Modifier: [id number]                                                        <+> 1         Final Counts Performed By        <+> 1         Final Count Status     05/19/20 14:41:11         Owner: [id number]                               Modifier: [id number]                                                            1     <*> Items included in the Initial Count    Sponges, Sharps            1     <+> Items Included in Final Count        [facility] - Counts Additional                                                                                                  Entry 1  Additional Count          Closing Count                   Additional Count                Metts,  Sam J-RN,    Type                                                      Participants                    ORANGE, LPN, DELORIS L    Count Status              Correct                         Items Counted                   Sponges, Sharps,                                                                                              Other/See Comments    Outcome Met (O.20)        Yes    Last Modified ByTrecia Rogers,  Sam J-RN                              05/19/20 15:32:14      [facility] - Counts Additional Audit                                                                   05/19/20 15:32:14         Owner: [id number]                               Modifier: [id number]                                                        <+> 1         Additional Count Participants        <+> 1         Count Status        <+> 1         Outcome Met (O.20)        [facility] - Dressing/Packing  Entry 1                                                                                                          Site                      Breast                          Site Details                    Left    Dressing Item     Details      Dressing Item            4x4's, Occlusive     (Im.290)                 Dressing, Wound Closure                              Strip    Last Modified By:         Alexis Goodell J-RN                              05/19/20 14:56:19    General Comments:            Steristrips, 4x4s, Tegaderm      [facility] - Dressing/Packing Audit                                                                    05/19/20 14:56:19         Owner: [id number]                               Modifier: [id number]                                                            1     <*> Dressing Item (Im.290)                  4x4's, Occlusive Dressing, Wound Closure Strip            1     <*> Dressing Item (Im.290)                 4x4's, Occlusive Dressing, Wound Closure Strip            1     <*> Dressing  Item (Im.290)                 4x4's, Occlusive Dressing, Wound Closure Strip            1     <*> Dressing Item (Im.290)                 4x4's, Occlusive Dressing, Wound Closure Strip            1     <*> Dressing Item (Im.290)                 4x4's, Occlusive Dressing, Wound Closure Strip            1     <*> Dressing Item (Im.290)                 4x4's, Occlusive Dressing, Wound Closure Strip            1     <*> Dressing Item (Im.290)                 Liquid Bandage            1     <*> Dressing Item (Im.290)                 Liquid Bandage            1     <*> Dressing Item (Im.290)                 Liquid Bandage            1     <*> Dressing Item (Im.290)                 Liquid Bandage            1     <*> Dressing Item (Im.290)                 Liquid Bandage            1     <*> Dressing Item (Im.290)                 Liquid Bandage            1     <*> Dressing Item (Im.290)                 Liquid Bandage        [facility] - Fire Risk Assessment                                                                                               Entry 1  Fire Risk                 Alcohol Based Prep              Fire Risk Score                 3+    Assessment: If            Solution, Surgical Site    checked, checkmark        Above Xiphoid, Ignition    = 1 point                 Source In Use    Fire Risk Protocol     (Reference Only)     Last Modified By:         Alexis Goodell J-RN                              05/19/20 14:40:44      [facility] - Medications                                                                                                        Entry 1                         Entry 2                         Entry 3                                           Time Administered         05/19/20 14:33:00               05/19/20 14:38:00               05/19/20 14:38:00    Medication                ISOSULFAN BLUE                  BUPIVACAINE 0.5% MPF INJ        LIDOCAINE 1% INJ                              INJECTION '10MG'$ /ML  5 ML    Route of Admin            Local Injection                 Local Injection                 Local Injection    Dose/Volume               3cc given  see comment                     see comment    (include amount and     unit of measure)     Site                      Breast                          Breast                          Breast    Site Detail               Left                            Left                            Left    Administered By           Evon Slack A-MD          Evon Slack A-MD          Evon Slack A-MD    Outcome Met (O.130)       Yes                             Yes                             Yes    Last Modified By:         Alexis Goodell J-RN                Metts,  Sam J-RN                Metts,  Sam J-RN                              05/19/20 14:42:43               05/19/20 14:42:43               05/19/20 14:42:43    General Comments:            1% Lidocaine plain 30cc mixed 1:1 with 0.5% Marcaine plain 30cc  total of mixture given: 20cc given      [facility] - Patient Care Devices                                                                                               Entry 1                         Entry 2  Equipment Type            MACHINE SEQUENTIAL              BAIR HUGGER                              COMPRESSION    SCD Sleeve Site           Legs Bilateral    Equipment/Tag Number      [id number]                       [id number]    Initiated Pre             Yes    Induction     Last Modified By:         Alexis Goodell J-RN                Metts,  Sam J-RN                              05/19/20  14:44:24               05/19/20 14:44:24    General Comments:            patient temperature monitored by anesthesia, warming blanket used  Neoprobe [id number]      [facility] - Patient Positioning                                                                                                Entry 1                                                                                                          Procedure                 Breast Lumpectomy with          Body Position                   Supine                              Sentinel Node Bx(Left)    Left Arm Position         Extended on Padded Arm          Right Arm Position              Extended on Padded Arm  Board w/Security Strap                                          Board w/Security Strap    Left Leg Position         Extended Security               Right Leg Position              Pillow Under Knee,                              Strap, Pillow Under Knee                                        Extended Security Strap    Feet Uncrossed            Yes                             Pressure Points                 Yes                                                              Checked     Additional                final position approved         Positioning Device              Pillow, Pillow, Safety    Information               by surgeon; safety                                              Strap, Foam Padding,                              strap across thighs;                                            Foam Padding, Foam                              foam padding under                                              Padding, Foam Padding  heels; pillow under                              knees; foam padding                              under bilateral arms    Positioned By             Metts,  Sam J-RN,               Outcome Met (O.80)              Yes                              THOMAS,  BRADY W-CRNA,                               RUSSO,  ANTHONY J-MD    Last Modified By:         Alexis Goodell J-RN                              05/19/20 14:48:08      [facility] - Time Out                                                                                                           Entry 1                                                                                                          Procedure                 Breast Lumpectomy with          Patient name and                Yes                              Sentinel Node Bx(Left)          DOB confirmed.  Allergies                                                               confirmed. Surgical                                                               procedure to be                                                               performed confirmed                                                               and verified by                                                               completed surgical                                                               consent     Surgical site             Yes                             Essential imaging,              Yes    confirmed. Correct                                        required blood     surgical site                                             products, implants,     marked and initials                                       devices and/or     are visible  through                                       special equipment     prepped and draped                                        available and     field (or                                                 sterilization     alternative ID band                                       indicators confirmed     used), if applicable     Surgeon shares            Yes                             Anesthesia shares               Yes    operative plan,                                           anesthetic plan and     anticipated                                                reviews patient     specimens                                                 specific concerns     discussed, possible                                       and confirms     difficulties,                                             administration of     expected duration,                                        antibiotics, if     anticipated blood  applicable     loss and reviews     all     critical/specific     concerns     Fire risk                 Yes                             Surgeon states:                 Yes    assessment scored                                         Does anyone have     and plan discussed                                        any concerns? If                                                               you see, suspect,                                                               or feel that                                                               patient care is                                                               being compromised,                                                               speak up for                                                               patient safety     Time Out Complete         05/19/20 14:30:00    Last Modified  By:         Alexis Goodell J-RN                              05/19/20 14:44:40      [place] - Debrief                                                                                                            Entry 1                                                                                                          Procedure                 Breast Lumpectomy with          Final counts                    Yes                              Sentinel Node Bx(Left)          correct and                                                               verbally verified                                                               with                                                                surgeon/licensed  independent                                                               practitioner (if                                                               applicable)     Actual procedure          Yes                             Postop diagnosis                Yes    performed confirmed                                       confirmed     Wound                     Yes                             Confirm specimens               Yes    classification                                            and specimens     confirmed                                                 labeled                                                               appropriately (if                                                               applicable)     Foley catheter            Yes                             Patient recovery                Yes  removed (if                                               plan confirmed     applicable)     Debrief Complete          05/19/20 15:32:00    Last Modified By:         Alexis Goodell J-RN                              05/19/20 15:32:28      [place] - Debrief Audit                                                                             05/19/20 15:32:28         Owner: [id number]                               Modifier: [id number]                                                            1     <*> Procedure                              Breast Lumpectomy with Sentinel Node Bx(Left)            1     <+> Debrief Complete        [facility] - Skin Assessment                                                                                                    Entry 1                                                                                                          Skin Integrity  Not Intact/Abnormality          Abnormality                     patient has mass on                                                               Description                     upper lip right side -                                                                                              blood blister like                                                                                              appearance    Last Modified By:         Alexis Goodell J-RN                              05/19/20 14:45:25      [facility] - Skin Prep                                                                                                          Entry 1                         Entry 2                                                                          Hair Removal      Hair Removal By      Hair Removal Methods  Hair Removal Site      Hair Removal Site      Details     Skin Prep      Prep Agents (Im.270)     Chlorhexidine Gluconate         Chlorhexidine Gluconate                              2% w/Alcohol                    2% w/Alcohol     Prep Area (Im.270)       Chest and Trunk,                Chest and Trunk,                              Breast, Arm Upper,              Breast, Arm Upper,                              Axilla                          Axilla     Prep Area Details        Left                            Left     Prep By                  Metts,  Sam J-RN                Metts,  Sam J-RN    Outcome Met (O.100)       Yes                             Yes    Last Modified By:         Alexis Goodell J-RN                Metts,  Sam J-RN                              05/19/20 14:46:11               05/19/20 14:46:11    General Comments:            10.79m chloraprep x2 used      [facility] - Specimens                                                                                                          Entry 1  Entry 2                         Entry 3                                          Description               A. Left Axillary                B. Left Breast Cancer;          C. Left Breast Deep                               Sentinel Lymphnodes;            short - superior, long          Margin, suture marks                              out 1449, in formalin           - lateral; out 1509, in         new margin; out 1524                              1450                            formalin 1529    Specimen Type             Routine                         Routine                         Routine    Date/Time in              05/19/20 14:50:00               05/19/20 15:29:00               05/19/20 15:24:00    Formalin (for     breast tissue only)     Last Modified By:         Alexis Goodell J-RN                Metts,  Sam J-RN                Metts,  Sam J-RN                              05/19/20 14:54:14               05/19/20 16:00:48               05/19/20 15:27:47      [facility] - Specimens Audit  05/19/20 16:00:48         Owner: [id number]                               Modifier: [id number]                                                            2     <*> Description                            B. Left Breast Cancer; short - superior, long - lateral; out                                                             1509, in formalin            2     <+> Date/Time in Formalin (for breast                      tissue only)     05/19/20 15:27:47         Owner: [id number]                               Modifier: [id number]                                                        <+> 3         Description        <+> 3         Specimen Type        <+> 3         Date/Time in Formalin (for breast                      tissue only)     05/19/20 15:19:13         Owner: [id number]                               Modifier: [id number]                                                        <+> 2         Description        <+> 2         Specimen Type        [facility] - Transfer  Entry 1                                                                                                           Via                       Stretcher                       Post-op Destination             PACU    Skin Assessment      Condition                Not Intact / Abnormality        Description                     see comment    Last Modified ByAlexis Goodell J-RN                              05/19/20 14:45:35    General Comments:            patient has mass on upper lip right side - blood blister like appearance      Case Comments                                                                                         <None>              Finalized By: Gershon Cull G-RN      Document Signatures                                                                             Signed By:           Alexis Goodell J-RN 05/19/20 15:52          Metts,  Sam J-RN 05/19/20 16:00          Gershon Cull G-RN 06/01/20 15:01      Unfinalized History  Date/Time            Username    Reason for Unfinalizing         Freetext Reason for Unfinalizing                                          05/19/20 15:59       [id number]      [facility] Documentation          06/01/20 15:00       STEPMI      Modify Pick List

## 2020-05-19 NOTE — Op Note (Signed)
Phase I Record - [place]             Phase I Record - [place] Summary                                                                   Primary Physician:        Evon Slack A-MD    Case Number:              [id number]    Finalized Date/Time:      05/19/20 16:33:40    Pt. Name:                 Kyliee, Ortego [facility]    D.O.B./Sex:               1961/05/19    Female    Med Rec #:                [id number]    Physician:                Evon Slack A-MD    Financial #:              [id number]    Pt. Type:                 S    Room/Bed:                 /    Admit/Disch:              05/19/20 11:38:00 -    Institution:       [facility] - Case Attendance - Phase I                                                                                          Entry 1                                                                                                          Case Attendee             Gilford Rile, RN, Perla               Role Performed                  Post Anesthesia Care  Nurse    Last Modified By:         Gilford Rile, RN, Carol                              05/19/20 16:00:06      [place] - Case Times - Phase I                                                                                               Entry 1                                                                                                          Phase I In                05/19/20 15:46:00               Phase I Out                     05/19/20 16:26:00    Phase I Discharge         05/19/20 16:26:00    Time     Last Modified By:         Gilford Rile, RN, Carol                              05/19/20 16:33:38      [place] - Case Times - Phase I Audit                                                                05/19/20 16:33:38         Owner: Octaviano Batty                              Modifier: Octaviano Batty                                                       <+> 1         Phase I Out        <+>  1         Phase I Discharge Time  Finalized By: Gilford Rile, RN, Carol      Document Signatures                                                                             Signed By:           Gilford Rile, RN, Carol 05/19/20 16:33

## 2020-05-19 NOTE — Assessment & Plan Note (Signed)
PreOp Record - [facility]             PreOp Record - [facility] Summary                                                                     Primary Physician:        Consuello Bossier A-MD    Case Number:              [id number]    Finalized Date/Time:      05/19/20 12:51:10    Pt. Name:                 Robin Bell, Robin Bell [facility]    D.O.B./Sex:               [birth year]    Female    Med Rec #:                [id number]    Physician:                Consuello Bossier A-MD    Financial #:              [id number]    Pt. Type:                 S    Room/Bed:                 /    Admit/Disch:              05/19/20 11:38:00 -    Institution:       [facility] - Case Attendance - Preop                                                                                            Entry 1                         Entry 2                                                                          Case Attendee             Esmeralda Arthur, RN, Haig Prophet,  [facility]    Role Performed            Preoperative Nurse              Preoperative Nurse    Time In     Time Out     Last Modified  By:         Hurshel Party,  Roseanne-RN                              05/19/20 12:50:00               05/19/20 12:50:17      [facility] - Case Attendance - Preop Audit                                                             05/19/20 12:50:17         Owner: [id number]                               Modifier: [id number]                                                        <+> 2         Case Attendee        <+> 2         Role Performed        [facility] - Case Times - PreOp                                                                                                 Entry 1                                                                                                          Patient In Room Time      05/19/20 12:13:00               Nurse In Time                   05/19/20 12:13:00    Nurse Out Time            05/19/20 12:50:00               Patient Ready for                05/19/20 12:50:00  Surgery/Procedure     Last Modified By:         Sheria Lang,  Foy [place]                              05/19/20 12:50:34              Finalized By: Birdie Hopes      Document Signatures                                                                             Signed By:           Birdie Hopes 05/19/20 12:51

## 2020-05-19 NOTE — Case Communication (Signed)
Discharge Follow-Up Form - Text       Discharge Follow-Up Entered On:  05/25/2020 16:39 EST    Performed On:  05/25/2020 16:38 EST by Excell Seltzer, RN, Yvette               Clinical   Provider Follow-Up Post Discharge RTF :   Follow-Up Appointments    With:  Consuello Bossier A-MD  Address:  , , , ;[phone]  When:  1 week  Comment:  to review your results       Excell Seltzer, RNBronson Ing - 05/25/2020 16:38 EST   Surgery Evaluation   CM Surg DC Instr Clr :   Yes   Excell Seltzer RN, Yvette - 05/25/2020 16:38 EST   Status   Case Status :   Completed   Excell Seltzer, RN, Yvette - 05/25/2020 16:38 EST

## 2020-05-19 NOTE — Case Communication (Signed)
Discharge Follow-Up Form - Text       Discharge Follow-Up Entered On:  05/22/2020 18:37 EST    Performed On:  05/22/2020 18:35 EST by Gilford Rile, RN, [place]               Clinical   Provider Follow-Up Post Discharge RTF :   Follow-Up Appointments    With:  Evon Slack A-MD  Address:  , , , ;[phone]  When:  1 week  Comment:  to review your results       Josephine Cables - 05/22/2020 18:35 EST   Surgery Evaluation   CM Surg DC Instr Clr :   Yes   CM Surg Pain Controlled :   Yes   CM Surg Nausea/Vomiting :   No   CM Surg FU Appt :   Yes   CM Surg Staff Recognize :   Yes   CM Surg Staff Recognize Names :   Every single peron.   Gilford Rile RN, Carol - 05/22/2020 18:35 EST

## 2020-05-19 NOTE — Op Note (Signed)
Phase II Record - [facility]             Phase II Record - [place] Summary                                                                  Primary Physician:        Evon Slack A-MD    Case Number:              [id number]    Finalized Date/Time:      05/19/20 17:54:45    Pt. Name:                 Robin Bell, Robin [facility]    D.O.B./Sex:               19-Feb-1961    Female    Med Rec #:                [id number]    Physician:                Evon Slack A-MD    Financial #:              [id number]    Pt. Type:                 S    Room/Bed:                 /    Admit/Disch:              05/19/20 11:38:00 -    Institution:       [facility] - Case Attendance - Phase II                                                                                         Entry 1                         Entry 2                                                                          Case Attendee             Evon Slack A-MD          Gilford Rile, RN, [place]    Role Performed            Surgeon Primary                 Phase II Nurse    Time In     Time Out     Last Modified By:  Gilford Rile, RN, Wilhemina Bonito, RN, Carol                              05/19/20 15:59:55               05/19/20 16:32:17      [place] - Case Attendance - Phase II Audit                                                          05/19/20 16:32:17         Owner: Octaviano Batty                              Modifier: Octaviano Batty                                                       <+> 2         Case Attendee        <+> 2         Role Performed        [facility] - Case Times - Phase II                                                                                              Entry 1                                                                                                          Phase II In               05/19/20 16:26:00               Phase II Out                    05/19/20 16:56:00    Phase II Discharge        05/19/20 17:29:00    Time     Last Modified By:          Gilford Rile, RN, Arbie Cookey  05/19/20 17:54:38      [facility] - Case Times - Phase II Audit                                                               05/19/20 17:54:38         Owner: Octaviano Batty                              Modifier: Octaviano Batty                                                       <+> 1         Phase II Out        <+> 1         Phase II Discharge Time     05/19/20 17:54:16         Owner: Octaviano Batty                              Modifier: [email or web address]                                                       <+> 1         Phase II In     05/19/20 16:33:14         Owner: Octaviano Batty                              Modifier: [email or web address]                                                           1     <-> Phase II Out                           05/19/20 16:26:00            1     <-> Phase II In                            05/19/20 15:46:00            1     <-> Phase II Discharge Time                05/19/20 16:26:00                Finalized By: Gilford Rile, RN, Carol      Document Signatures  Signed By:           Gilford Rile, RN, Carol 05/19/20 17:54

## 2020-05-19 NOTE — Op Note (Signed)
Operative [facility]  Wetzel Bjornstad, MD  Service Date: 05/19/2020    PREOPERATIVE DIAGNOSIS:  Left breast cancer.    POSTOPERATIVE DIAGNOSIS:  Left breast cancer.    PROCEDURE PERFORMED:  1.  Left axillary sentinel lymph node mapping with sentinel  lymphadenectomy.  2.  Left partial mastectomy.    SURGEON:  Wetzel Bjornstad MD    ANESTHESIA:  General via LMA.    ESTIMATED BLOOD LOSS:  5 mL    URINE OUTPUT:  Not recorded.    Sharps, sponge correct times 2.    COMPLICATIONS:  None.    FINDINGS:    1.  There were 2 axillary sentinel lymph nodes, which were identified  and sent for permanent section analysis.  2.  Specimen mammogram demonstrated the mass as well as the clip  centrally placed with radiographically negative margins to my  interpretation  specimen.    SPECIMENS:  1.  Left axillary sentinel lymph nodes.  2.  Left partial mastectomy, left breast cancer, short superior, long  lateral.  3.  Left breast deep margin, suture marks new margin.    FINDINGS:  Robin Bell is a 59 year old female who presented  to me after undergoing a core needle biopsy of the left breast  demonstrating invasive carcinoma.  Tumor was centrally located and  just inferior to her nipple.  While there was no nipple or erosion  proceed.  This was tethered to the back of the areola and causing some  retraction of the nipple on that side.  I sent her for consultation  preoperatively for consideration of neoadjuvant chemotherapy, but  after thorough multidisciplinary discussion, she was recommended to  proceed to surgery first with consideration for adjuvant chemotherapy  to follow based off the sentinel lymph node examination.  Given the  location in the retroareolar left breast.  We discussed the central  lumpectomy as the post-optimal approach for her as well as a sentinel  lymph node biopsy at the same time.  She understood, asked appropriate  questions.  She desired to proceed and provided both  verbal and  written consent.    DESCRIPTION OF PROCEDURE:  She was identified in the preoperative  holding area and the operative site was appropriately marked.  She was  then brought to the operating room, placed in supine position.  All  pressure points were adequately padded.  She was then given general  anesthesia without complication or incident and her left breast and  axilla were then prepped and draped in the usual sterile fashion.  I  began a final timeout in standard fashion.  After final time-out was  done, I injected 5 mL of isosulfan blue dye to the intradermal portion  of the left upper outer quadrant breast skin and commenced with manual  breast massage for 5 minutes.  After 5 minutes elapsed, I made a  curvilinear incision in the low axilla and carried the dissection down  through the subcutaneous tissues using electrocautery.  I then  identified and incised the clavipectoral fascia and then commenced  with a manual as well as visual exploration of the axillary fat pad.   I identified a cluster of axillary nodes in the level 1 position,  which contained blue dye and were also hot using the Neoprobe.  The  highest axillary lymph node count ex vivo was 553 ___.  The second  lymph node had counts in the 200s and the 260 range.  Background count  after excision of these hot and blue lymph nodes was less than 5.   Manual exploration additionally did not identify any additional  palpably suspicious lymph nodes.  These lymph nodes were then passed  off the field and forwarded to pathology for permanent section  analysis.  I then turned my attention to the left breast and injected  local anesthetic and then made a small elliptical incision  encompassing the nipple-areolar complex on the left.  I then developed  skin flaps cephalad medially, inferiorly and laterally in order to  free the tumor.  After this was done, the palpably suspicious the  palpable central tumor was then excised using electrocautery.   This  was then oriented with a short stitch marking superior, long stitch  marking lateral and the areolar skin would obviously marking  superficial.  This was passed off the field and underwent immediate  specimen mammogram, which demonstrated the central location of the  tumor with radiographically negative margins to my interpretation.   Additional exploration of the cavity revealed a very tiny medial  posterior nodule and that felt slightly hard maybe measuring 3-5 mm.   This was excised and oriented and sent as the deep margin with a  suture marking the new true deep margin.  The wound was then irrigated  and clips were then placed and both skin incisions both incisions were  then closed with 3-0 Vicryl in a deep dermal fashion followed by 4-0  Monocryl in a running subcuticular fashion.  Steri-Strips and sterile  dressing were applied.  The patient was awakened and extubated in the  operating room and transferred to the recovery room in stable  condition.  She tolerated the procedure well.      Wetzel Bjornstad, MD  TR: tn DD: 05/19/2020 15:49 TD: 05/19/2020 16:31 Job#: [id number]  \\[id number]\\DOC#: [id number]  \\[id number]\\  Signature Line    Electronically Signed on 05/22/2020 10:50 AM EST  ________________________________________________  Evon Slack A-MD

## 2020-06-26 LAB — COMPREHENSIVE METABOLIC PANEL
ALT: 17 U/L (ref 0–33)
AST: 16 U/L (ref 0–32)
Albumin/Globulin Ratio: 1.6 mmol/L (ref 1.00–2.70)
Albumin: 4.2 g/dL (ref 3.5–5.2)
Alk Phosphatase: 88 U/L (ref 35–117)
Anion Gap: 11 mmol/L (ref 2–17)
BUN: 16 mg/dL (ref 6–20)
CO2: 27 mmol/L (ref 22–29)
Calcium: 8.8 mg/dL (ref 8.6–10.0)
Chloride: 102 mmol/L (ref 98–107)
Creatinine: 0.7 mg/dL (ref 0.5–1.0)
GFR African American: 111 mL/min/{1.73_m2} (ref >=90)
GFR Non-African American: 96 mL/min/{1.73_m2} (ref >=90)
Globulin: 3 g/dL (ref 1.9–4.4)
Glucose: 100 mg/dL — ABNORMAL HIGH (ref 70–99)
OSMOLALITY CALCULATED: 281 mOsm/kg (ref 270–287)
Potassium: 4.9 mmol/L (ref 3.5–5.3)
Sodium: 140 mmol/L (ref 135–145)
Total Bilirubin: 0.3 mg/dL (ref 0.00–1.20)
Total Protein: 6.8 g/dL (ref 6.4–8.3)

## 2020-09-17 LAB — TSH WITH REFLEX TO FT4: TSH: 2.67 mcIU/mL (ref 0.358–3.740)

## 2020-12-02 LAB — COMPREHENSIVE METABOLIC PANEL
ALT: 20 U/L (ref 0–35)
AST: 20 U/L (ref 0–35)
Albumin/Globulin Ratio: 2 mmol/L (ref 1.00–2.70)
Albumin: 4.3 g/dL (ref 3.5–5.2)
Alk Phosphatase: 92 U/L (ref 35–117)
Anion Gap: 11 mmol/L (ref 2–17)
BUN: 15 mg/dL (ref 6–20)
Bun/Cre Ratio: 25 — ABNORMAL HIGH (ref 6.0–17.0)
CO2: 27 mmol/L (ref 22–29)
Calcium: 9 mg/dL (ref 8.6–10.0)
Chloride: 105 mmol/L (ref 98–107)
Creatinine: 0.6 mg/dL (ref 0.5–1.0)
Est, Glom Filt Rate: 104 mL/min/{1.73_m2} (ref >=90)
Globulin: 2.2 g/dL (ref 1.9–4.4)
Glucose: 171 mg/dL — ABNORMAL HIGH (ref 70–99)
OSMOLALITY CALCULATED: 290 mOsm/kg — ABNORMAL HIGH (ref 270–287)
Potassium: 3.9 mmol/L (ref 3.5–5.3)
Sodium: 143 mmol/L (ref 135–145)
Total Bilirubin: 0.18 mg/dL (ref 0.00–1.20)
Total Protein: 6.5 g/dL (ref 6.4–8.3)

## 2021-02-15 ENCOUNTER — Ambulatory Visit
Admit: 2021-02-15 | Discharge: 2021-02-15 | Payer: PRIVATE HEALTH INSURANCE | Attending: Family Medicine | Primary: Family Medicine

## 2021-02-15 DIAGNOSIS — C50112 Malignant neoplasm of central portion of left female breast: Secondary | ICD-10-CM

## 2021-02-15 MED ORDER — ROSUVASTATIN CALCIUM 10 MG PO TABS
10 MG | ORAL_TABLET | Freq: Every day | ORAL | 3 refills | Status: AC
Start: 2021-02-15 — End: 2021-05-16

## 2021-02-15 MED ORDER — SYNTHROID 125 MCG PO TABS
125 MCG | ORAL_TABLET | Freq: Every day | ORAL | 3 refills | Status: AC
Start: 2021-02-15 — End: 2021-05-16

## 2021-02-15 MED ORDER — PROPRANOLOL HCL ER 60 MG PO CP24
60 MG | ORAL_CAPSULE | Freq: Every day | ORAL | 3 refills | Status: AC
Start: 2021-02-15 — End: 2021-05-16

## 2021-02-15 MED ORDER — LOSARTAN POTASSIUM 50 MG PO TABS
50 MG | ORAL_TABLET | Freq: Every day | ORAL | 3 refills | Status: AC
Start: 2021-02-15 — End: ?

## 2021-02-15 MED ORDER — LOSARTAN POTASSIUM 25 MG PO TABS
25 MG | ORAL_TABLET | Freq: Every day | ORAL | 3 refills | Status: AC
Start: 2021-02-15 — End: 2021-05-16

## 2021-02-15 NOTE — Progress Notes (Signed)
Date of Service: 02/15/2021  Patient: Robin Bell  DOB: 07/22/1960  Chief Complaint   Patient presents with    Follow-up     6 month f/u no new complaints at this time          History of Present Illness:  Robin Bell a 60 y.o. female presents today for evaluation of the following issues:  Patient is a 60 year old female presenting for follow-up in clinic.  She recently finished with chemo and radiation for breast cancer.  She only had to do radiation for 6 days.  She did well overall.  She is currently on letrozole and getting hot flashes a lot, worse than when she went through menopause.  She is struggling with joint pain.  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  Her skin is still doing well.      Moderna x 3, last booster 7/22.  Shingrix; flu shot fall;   Pap  Mg- albaneze and bellil          Current Outpatient Medications   Medication Sig Dispense Refill    letrozole (FEMARA) 2.5 MG tablet Take 2.5 mg by mouth in the morning.      losartan (COZAAR) 25 MG tablet Take 1 tablet by mouth in the morning. 90 tablet 3    propranolol (INDERAL LA) 60 MG extended release capsule Take 1 capsule by mouth in the morning. 90 capsule 3    rosuvastatin (CRESTOR) 10 MG tablet Take 1 tablet by mouth in the morning. 90 tablet 3    SYNTHROID 125 MCG tablet Take 1 tablet by mouth in the morning. TAKE 1 TABLET BY MOUTH EVERY MORNING ON AN EMPTY STOMACH. 90 tablet 3    losartan (COZAAR) 50 MG tablet Take 1 tablet by mouth in the morning. 90 tablet 3     No current facility-administered medications for this visit.       Allergies   Allergen Reactions    Penicillin G      Other reaction(s): Unknown     There is no problem list on file for this patient.    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      Past Surgical History:   Procedure Laterality Date    BRAIN TUMOR EXCISION  08/1989    Crainiotomy    BREAST BIOPSY Left 2021    BREAST LUMPECTOMY Right 12/1996    LYMPH NODE BIOPSY  04/2020    MASTECTOMY, PARTIAL Left 05/19/2020    With [facility]    SQUAMOUS CELL CARCINOMA EXCISION  2014    On Neck     Family History   Problem Relation Age of Onset    Hypertension Mother     Diabetes Mother     Rheum Arthritis Mother     Dementia Mother     Hypertension Father     Diabetes Father     Gout Father     Lung Cancer Father     Coronary Art Dis Father  Melanoma Father     Hypertension Sister     Melanoma Sister     Gout Sister     Breast Cancer Sister     Lupus Sister      Social History     Tobacco Use    Smoking status: Former     Packs/day: 0.50     Years: 15.00     Pack years: 7.50     Types: Cigarettes     Quit date: 2005     Years since quitting: 17.6    Smokeless tobacco: Never    Tobacco comments:     Patient counselled on the dangers of tobacco 12/01/2020, Cessation: Not applicable   Substance Use Topics    Alcohol use: Never     Social History     Social History Narrative    Not on file         ROS:    Fevers: no  Visual changes: no  Decreased hearing: no  Decreased sense of smell: no  Difficulty swallowing: no  Shortness of breath: no  Diarrhea: no  Nausea: no  Vomiting: no  Recent fall or injury: no  Painful joints: yes  Painful urination: no  Dizziness: no  Frequent falls: no  Depressed mood: no       PHYSICAL EXAM:    BP (!) 148/84    Pulse 57    Ht 5' 5.5" (1.664 m)    Wt 202 lb 5 oz (91.8 kg)    SpO2 97%    BMI 33.15 kg/m??   Vitals:    02/15/21 0811 02/15/21 0835   BP: (!) 140/78 (!) 148/84   Pulse: 57    SpO2: 97%    Weight: 202 lb 5 oz (91.8 kg)    Height: 5' 5.5" (1.664 m)             GENERAL: The patient is in no apparent distress.  Alert and oriented. Vital Signs Reviewed   LUNGS: Clear to auscultation bilaterally. Breath Sounds equal.   HEART: Regular rate and rhythm without murmur. No edema   MSK: No deformity. Gait WNL.   NEUROLOGIC: Alert and Oriented. No focal deficits.   PSYCHIATRIC: Cooperative, mood appropriate.      ASSESSMENT and PLAN:    1. 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  Orders:  -     Comprehensive Metabolic Panel; Future  -     Hemoglobin A1C; Future  -     Lipid Panel; Future  2. Psoriatic arthritis ([place])  Comments:  Doing well with skin, joints continue to hurt.  Orders:  -     Comprehensive Metabolic Panel; Future  -     Hemoglobin A1C; Future  -     Lipid Panel; Future  3. Hypothyroidism, unspecified type  Comments:  Recent lab within normal limit.  Orders:  -     Comprehensive Metabolic Panel; Future  -     Hemoglobin A1C; Future  -     Lipid Panel; Future  4. Hyperlipidemia, unspecified hyperlipidemia type  Comments:  Rechecking labs.  Orders:  -     Comprehensive Metabolic Panel; Future  -     Hemoglobin A1C; Future  -     Lipid Panel; Future  5. Prediabetes  Comments:  Discussed diet and exercise.  Rechecking.  Orders:  -     Comprehensive Metabolic Panel; Future  -  Hemoglobin A1C; Future  -     Lipid Panel; Future     Orders Placed This Encounter   Procedures    Comprehensive Metabolic Panel     Standing Status:   Future     Standing Expiration Date:   08/18/2022    Hemoglobin A1C     Standing Status:   Future     Standing Expiration Date:   08/18/2022    Lipid Panel     Standing Status:   Future     Standing Expiration Date:   08/18/2022         Return if symptoms worsen or fail to improve.        Shyrl Numbers, DO     Shyrl Numbers, DO

## 2021-03-01 NOTE — Telephone Encounter (Signed)
Please contact patient, stated she's moving to [place] next month. Would like to discuss further care and transferring of records.

## 2021-03-09 ENCOUNTER — Encounter

## 2021-03-10 LAB — COMPREHENSIVE METABOLIC PANEL
ALT: 26 U/L (ref 0–35)
AST: 24 U/L (ref 0–35)
Albumin/Globulin Ratio: 2 (ref 1.00–2.70)
Albumin: 4.7 g/dL (ref 3.5–5.2)
Alk Phosphatase: 100 U/L (ref 35–117)
Anion Gap: 12 mmol/L (ref 2–17)
BUN: 18 mg/dL (ref 6–20)
CO2: 25 mmol/L (ref 22–29)
Calcium: 10.2 mg/dL — ABNORMAL HIGH (ref 8.6–10.0)
Chloride: 102 mmol/L (ref 98–107)
Creatinine: 0.6 mg/dL (ref 0.5–1.0)
Est, Glom Filt Rate: 103 mL/min/1.73m?? (ref >=90)
Globulin: 2.4 g/dL (ref 1.9–4.4)
Glucose: 86 mg/dL (ref 70–99)
OSMOLALITY CALCULATED: 279 mOsm/kg (ref 270–287)
Potassium: 4.3 mmol/L (ref 3.5–5.3)
Sodium: 139 mmol/L (ref 135–145)
Total Bilirubin: 0.29 mg/dL (ref 0.00–1.20)
Total Protein: 7.1 g/dL (ref 6.4–8.3)

## 2021-03-10 LAB — HEMOGLOBIN A1C
Est. Avg. Glucose, WB: 128
Est. Avg. Glucose-calculated: 140
Hemoglobin A1C: 6.1 % — ABNORMAL HIGH (ref 4.0–6.0)

## 2021-03-10 LAB — LIPID PANEL
Chol/HDL Ratio: 3.6 (ref 0.0–4.4)
Cholesterol: 209 mg/dL — ABNORMAL HIGH (ref 100–200)
HDL: 58 mg/dL (ref >=50)
LDL Cholesterol: 112.2 mg/dL — ABNORMAL HIGH (ref 0.0–100.0)
LDL/HDL Ratio: 1.9
Triglycerides: 194 mg/dL — ABNORMAL HIGH (ref 0–149)
VLDL: 38.8 mg/dL (ref 5.0–40.0)

## 2021-05-02 ENCOUNTER — Encounter

## 2021-05-03 NOTE — Telephone Encounter (Signed)
Do you mind giving patient the info for records?  Thanks!

## 2021-06-08 ENCOUNTER — Encounter: Attending: Surgery | Primary: Family Medicine

## 2021-06-08 ENCOUNTER — Encounter: Primary: Family Medicine

## 2021-06-24 ENCOUNTER — Encounter

## 2021-06-28 MED ORDER — OXYBUTYNIN CHLORIDE 5 MG PO TABS
5 MG | ORAL_TABLET | ORAL | 5 refills | Status: DC
Start: 2021-06-28 — End: 2021-08-03

## 2021-08-03 ENCOUNTER — Encounter

## 2021-08-03 MED ORDER — OXYBUTYNIN CHLORIDE 5 MG PO TABS
5 MG | ORAL_TABLET | Freq: Every day | ORAL | 3 refills | Status: AC
Start: 2021-08-03 — End: 2021-11-01

## 2021-08-05 IMAGING — MR MRI BRAIN W/WO CONTRAST
8 of 11 series · 26 of 48 positions shown · IV contrast (15cc prohance)
Comparison: None

INDICATION: 60-year-old female with history of brain cancer.
TECHNIQUE: Multiplanar, multisequence MRI of the brain was performed without and with intravenous contrast. The patient received an intravenous dose of 15 mL ProHance.

[Series 5: flair_axial fs · axial · 4.0mm · 0.47mm/px · z∈[-52,+109]mm · 2 of 32 slices shown]
[im 1/32]
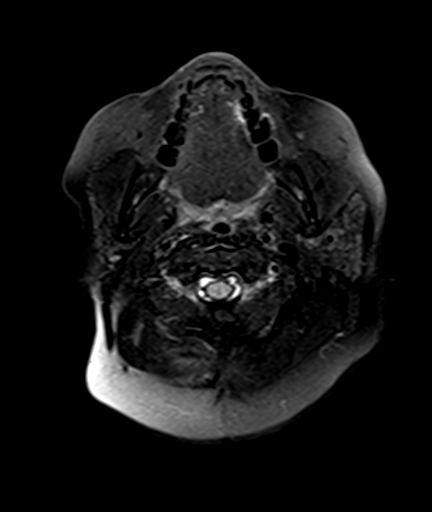
[im 32/32]
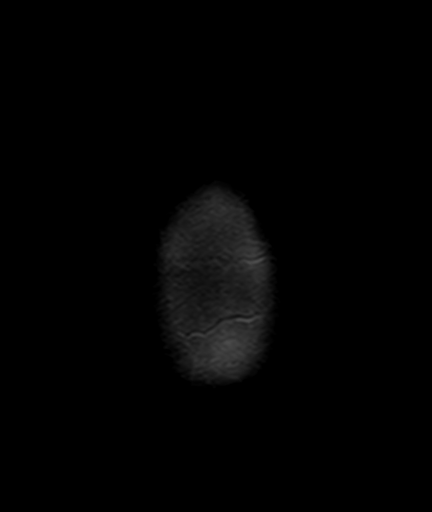

[Series 6: t2_axial · axial · 4.0mm · 0.38mm/px · 1 of 32 slices shown]
[im 1/32]
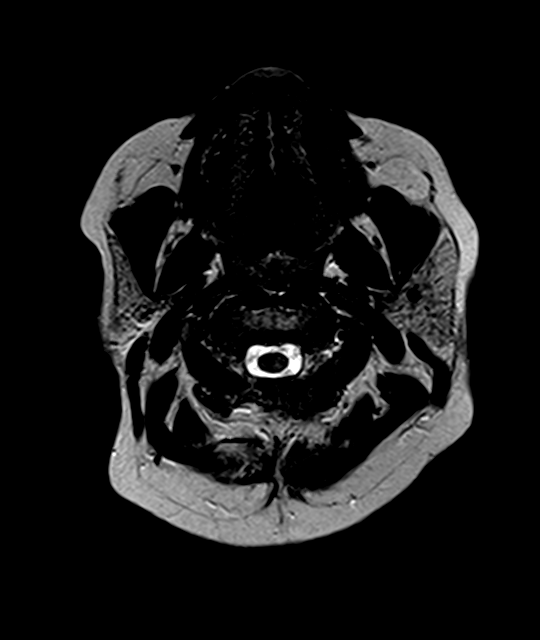

[Series 7: DWI · axial · 4.0mm · 1.36mm/px · 1 of 30 slices shown (1 of 2)]
[im 1/30]
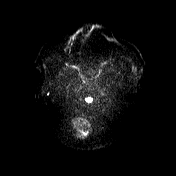

[Series 8: DWI · axial · 4.0mm · 1.36mm/px · 1 of 32 slices shown (2 of 2)]
[im 1/32]
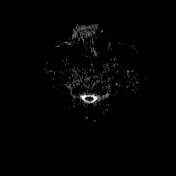

[Series 9: flash_axial · axial · 4.0mm · 0.94mm/px · 1 of 32 slices shown]
[im 1/32]
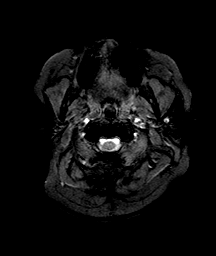

[Series 10: t1_mprage axial · axial · 1.0mm · 0.94mm/px · z∈[-52,+107]mm · 7 of 160 slices shown]
[im 1/160]
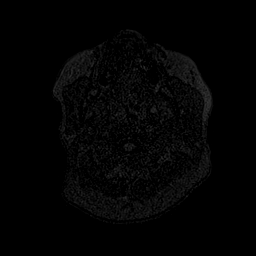
[im 27/160]
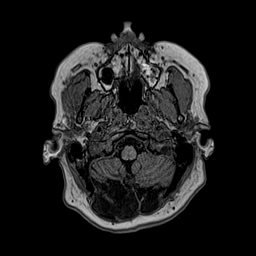
[im 54/160]
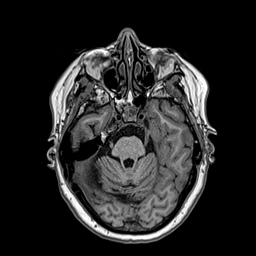
[im 80/160]
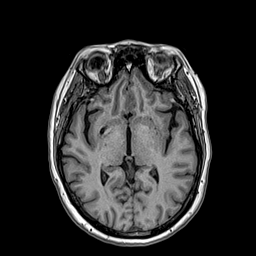
[im 107/160]
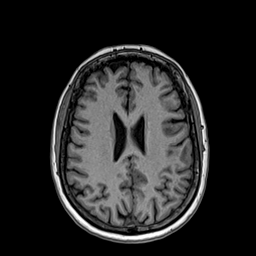
[im 133/160]
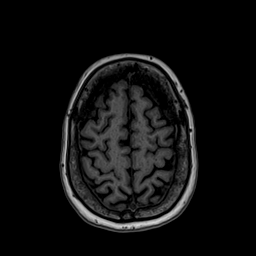
[im 160/160]
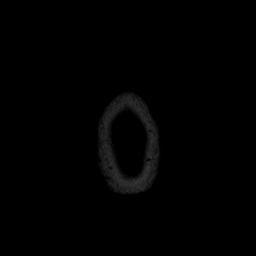

[Series 11: t1_mprage axial_mpr_mprage cor · coronal · 1.0mm · 0.47mm/px · 8 of 190 slices shown]
[im 1/190]
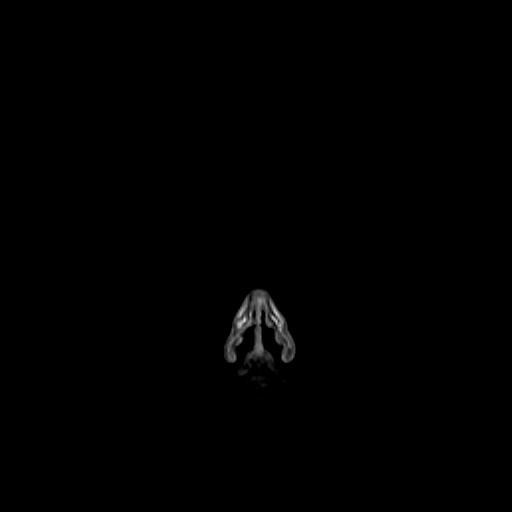
[im 28/190]
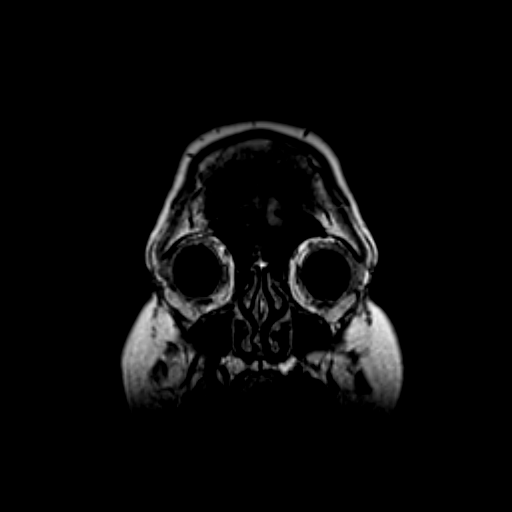
[im 55/190]
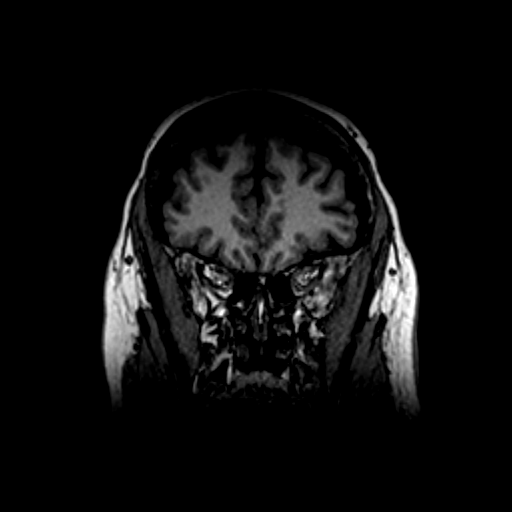
[im 82/190]
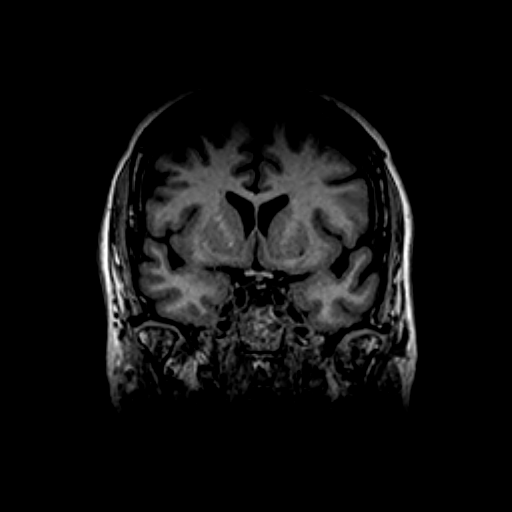
[im 109/190]
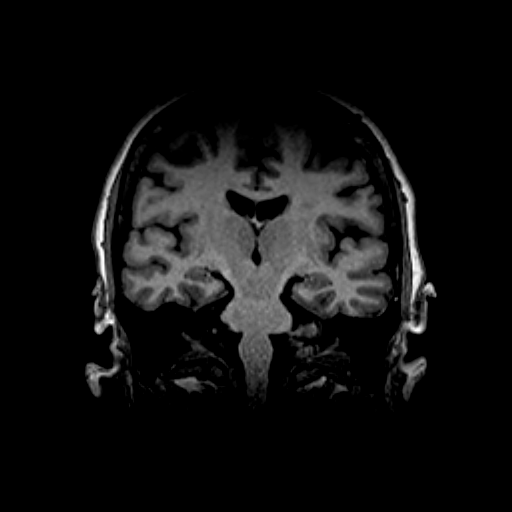
[im 136/190]
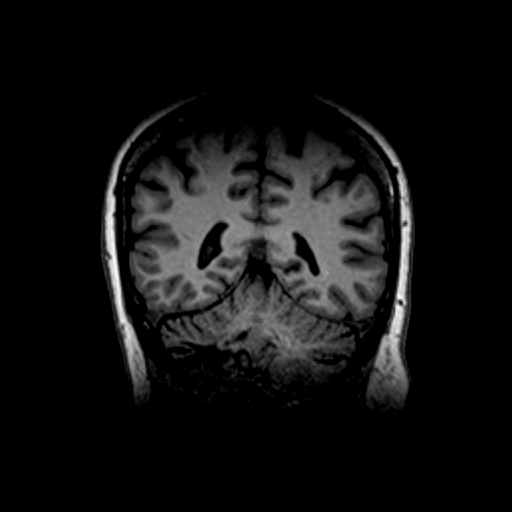
[im 163/190]
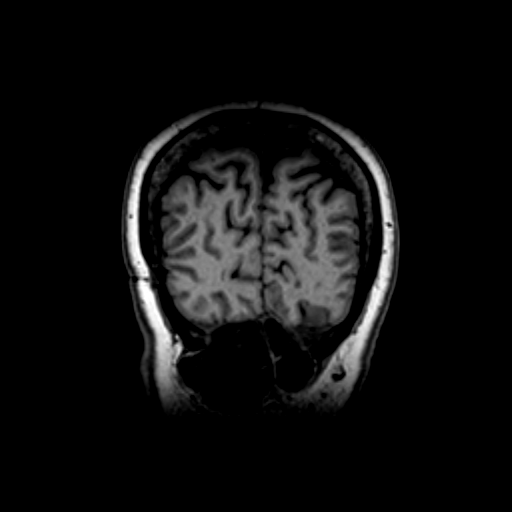
[im 190/190]
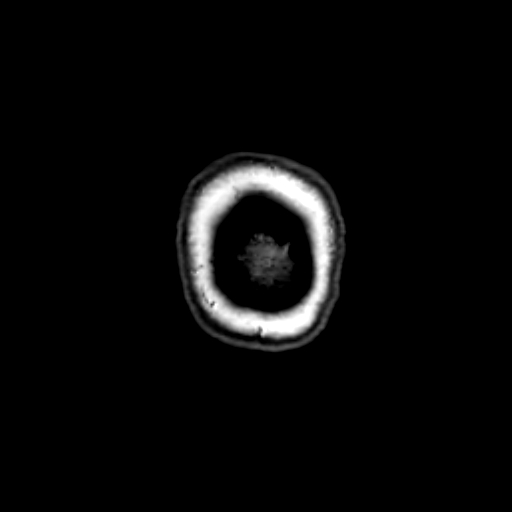

[Series 12: t1_mprage axial_mpr_mprage sag · sagittal · 1.0mm · 0.47mm/px · 5 of 150 slices shown]
[im 1/150]
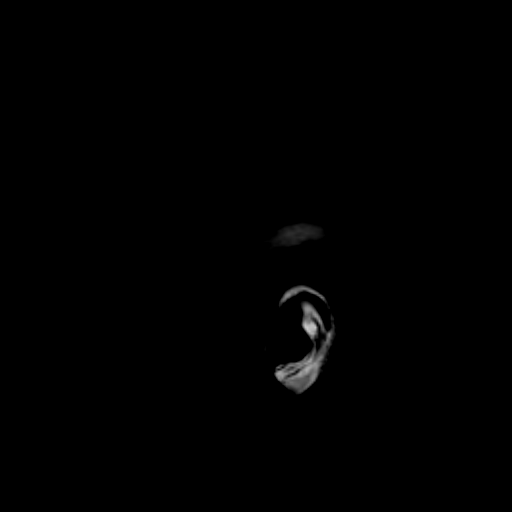
[im 30/150]
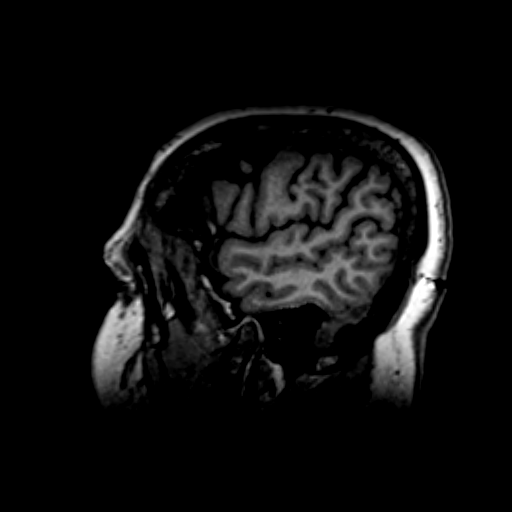
[im 60/150]
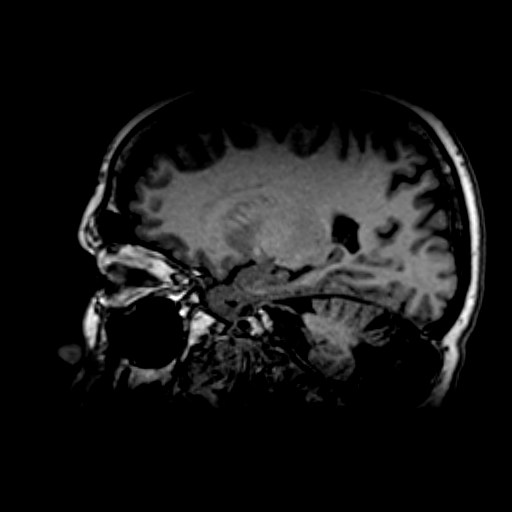
[im 90/150]
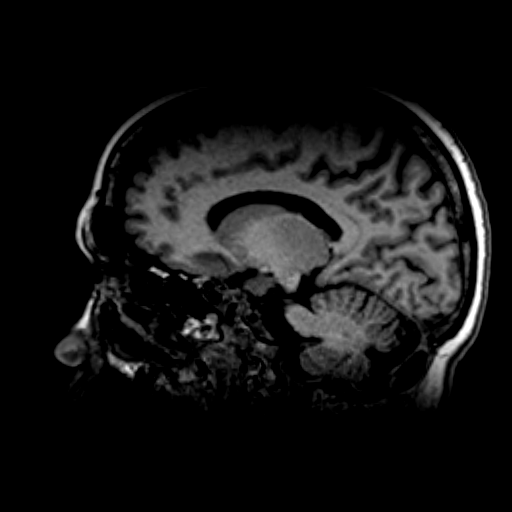
[im 120/150]
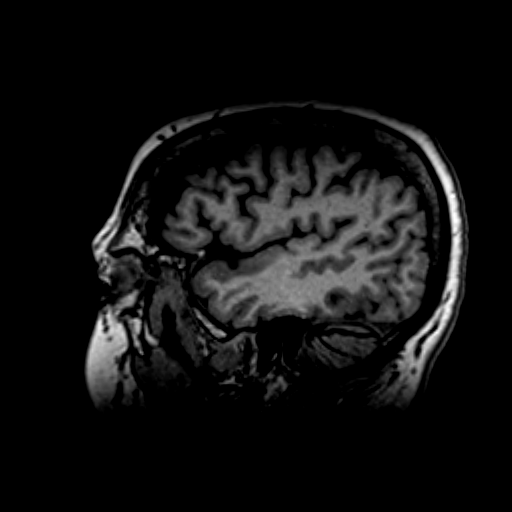

[26 of 48 positions shown; findings below may reference images not displayed]

FINDINGS: Brain parenchyma: Postsurgical changes from occipital craniectomy with cystic encephalomalacia involving the bilateral cerebellar hemispheres with protrusion of encephalomalacic tissue through the craniectomy defect. No nodular or masslike abnormality or enhancement to suggest recurrent malignancy. No acute infarct or intracranial hemorrhage. Mild T2/FLAIR hyperintense foci in the bilateral cerebral white matter, nonspecific, commonly seen with chronic small vessel ischemic change. Preserved vascular flow voids. 

Ventricles: No midline shift, herniation or hydrocephalus.

Extra-axial spaces: No extra-axial fluid collection.

Extracranial structures: Paranasal sinuses, mastoid air cells and middle ear cavities without significant disease. Marrow signal normal. Orbits unremarkable. Soft tissues normal.

Contrast enhanced views are negative for pathologic enhancement.
IMPRESSION: 1.
Postsurgical changes from occipital craniectomy with cystic encephalomalacia in the bilateral cerebellar hemispheres. No evidence of recurrent malignancy.

2.
Mild chronic small vessel ischemic change.

## 2021-08-05 IMAGING — MG MAMMO DIAG BIL W/CAD/TOMO
4 of 10 series · 4 of 34 positions shown · non-contrast
Comparison: No prior imaging studies are available for comparison.

HISTORY: Patient is 60 years old and is seen for diagnostic evaluation of personal breast cancer history in the subareolar of the left breast.
TECHNIQUE: Bilateral 2-D digital diagnostic mammogram was performed followed by 3-D tomosynthesis.  Current study was also evaluated with a computer aided detection (CAD) system.

MAMMOGRAM FINDINGS:
The breasts are heterogeneously dense, which may obscure small masses.
There are post-lumpectomy changes in the left breast.
No suspicious abnormality is seen in either breast.

[R MLO]
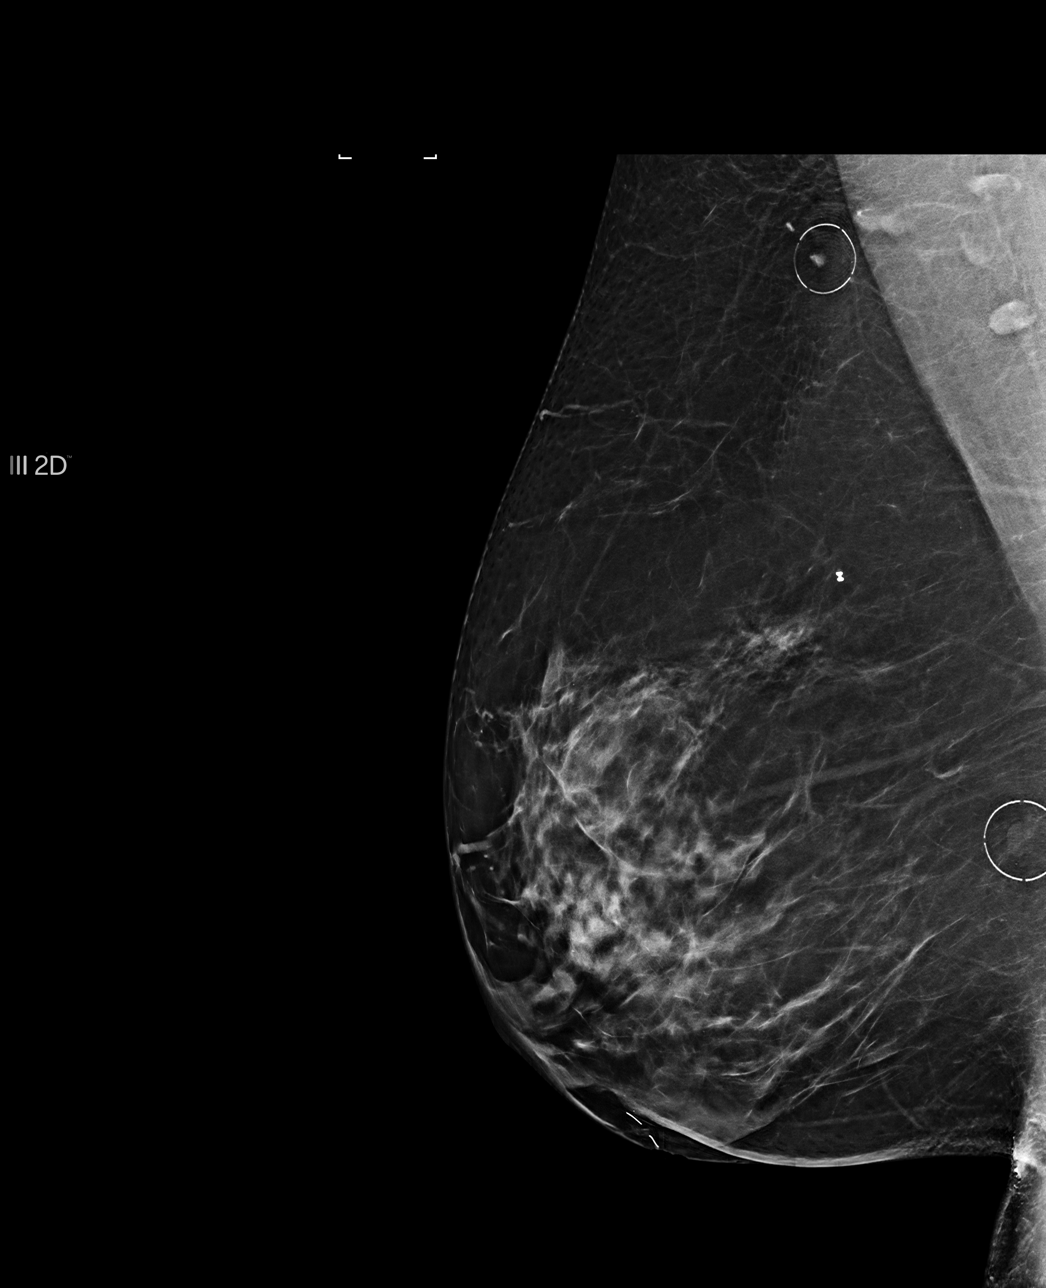

[L MLO]
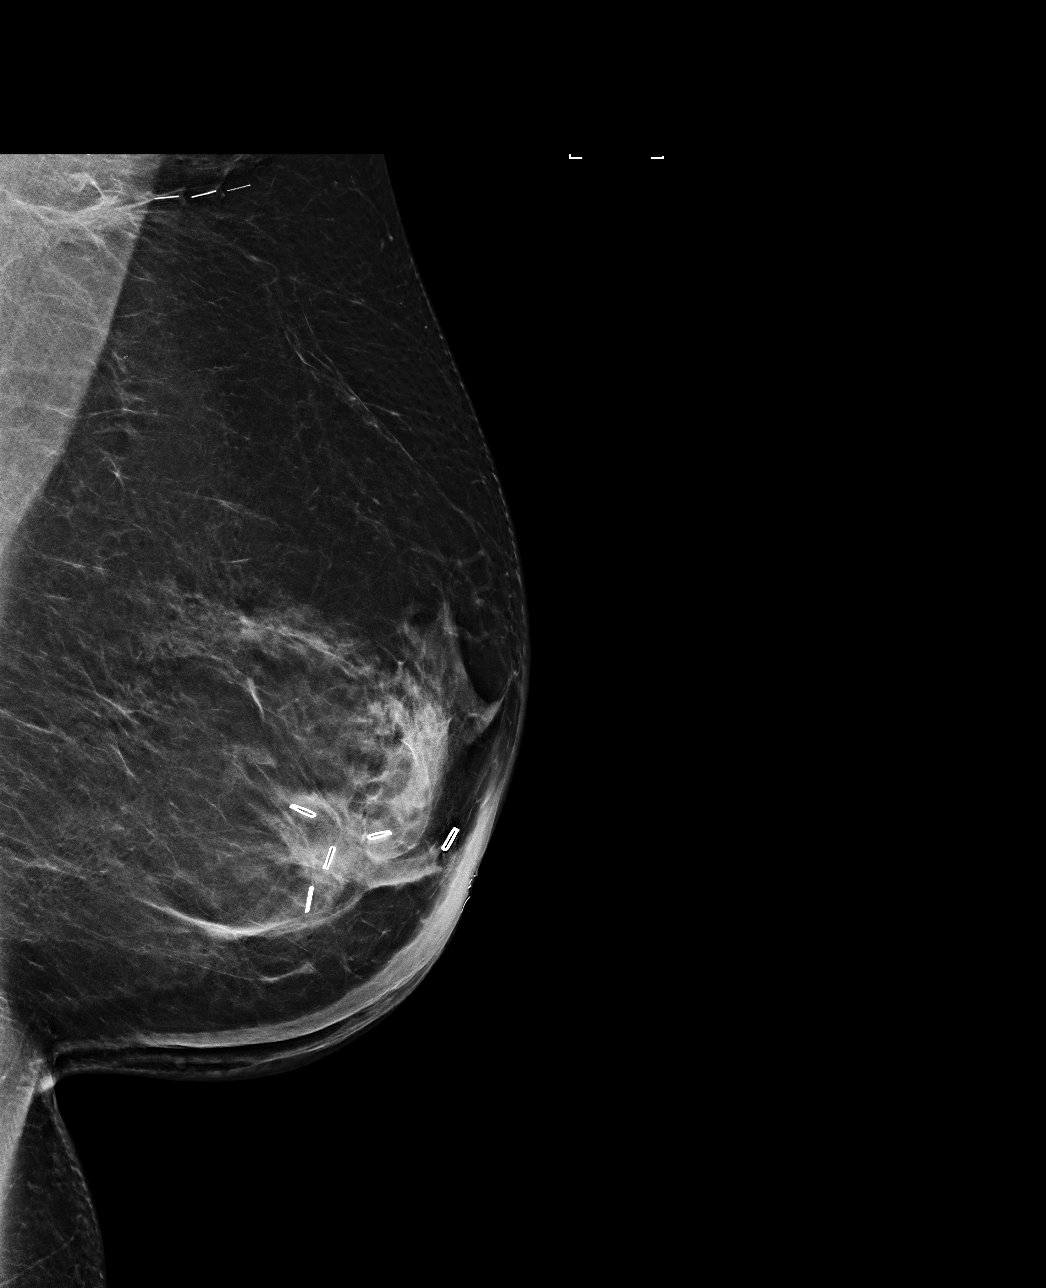

[R CC]
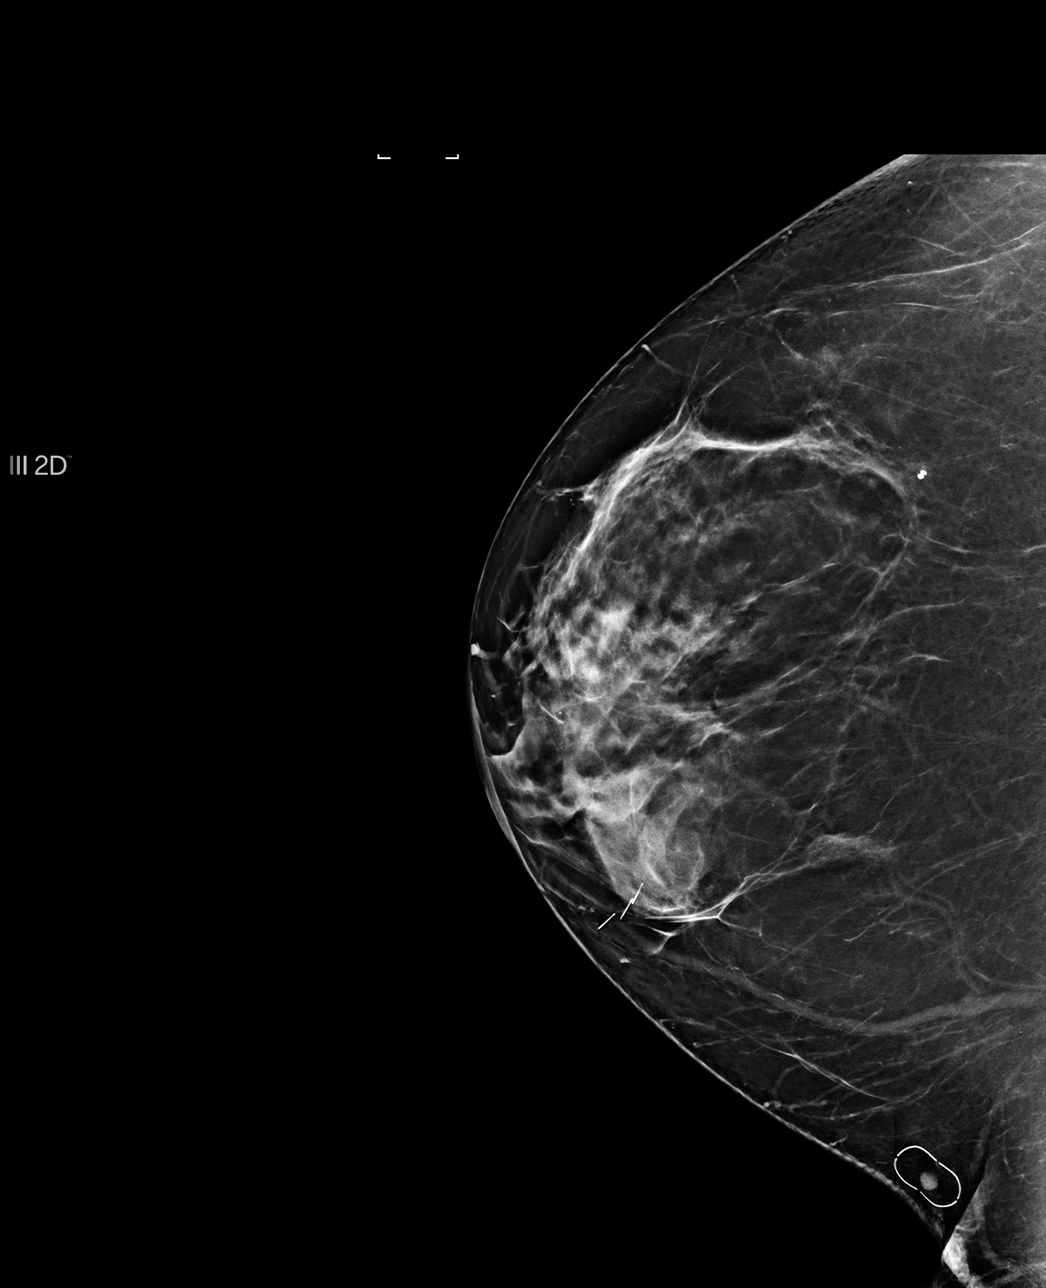

[L CC]
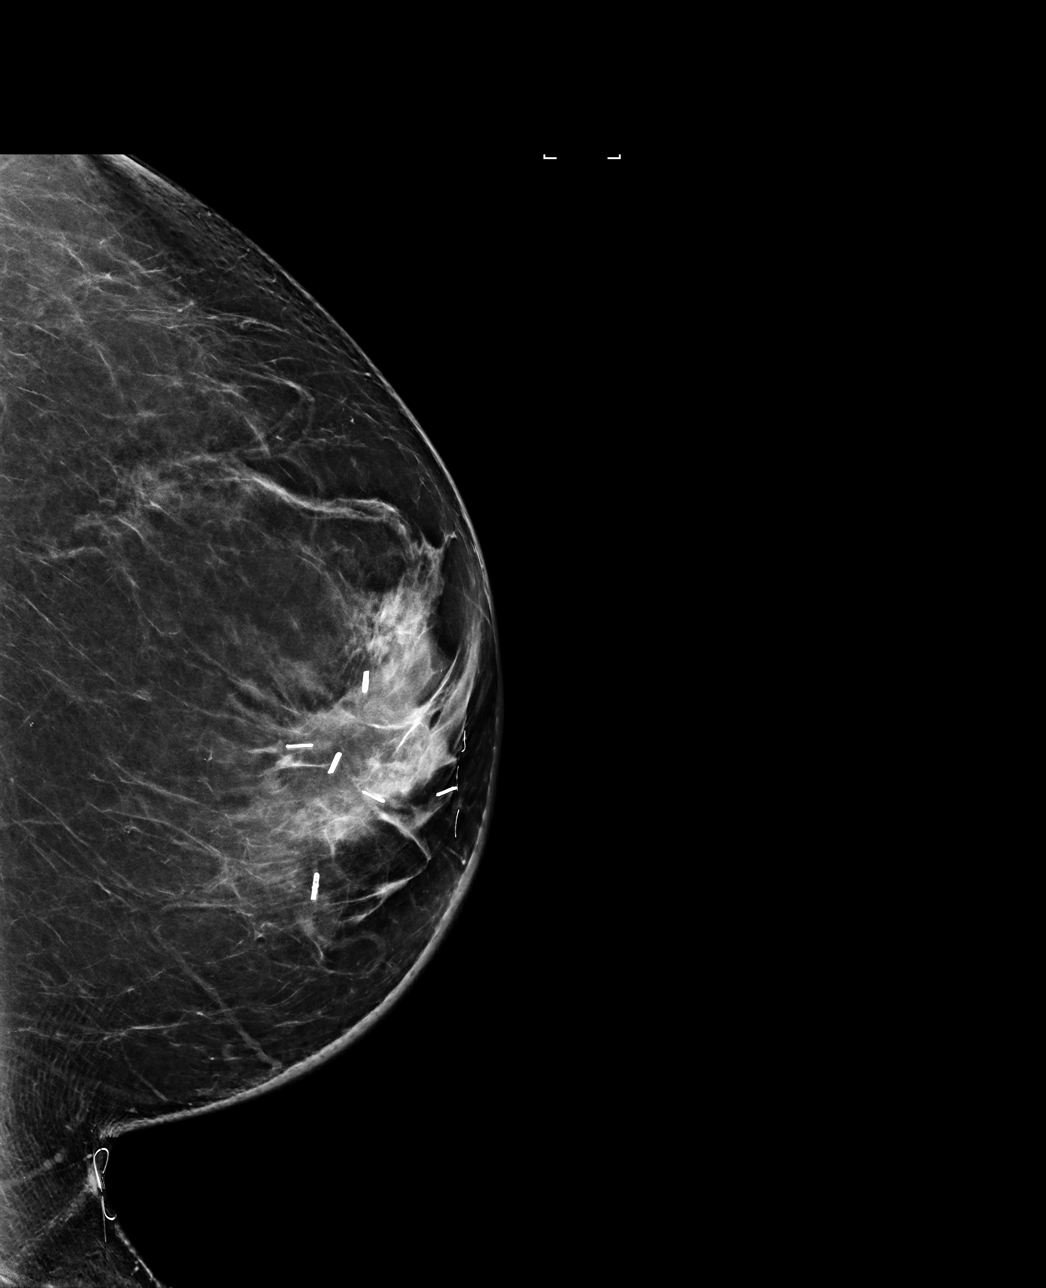

[4 of 34 positions shown; findings below may reference images not displayed]

IMPRESSION: There is no mammographic evidence of malignancy. Six-month follow-up left mammogram recommended per lumpectomy protocol.

The patient received a copy of the results at the end of the examination.

BI-RADS Category 2: Benign

## 2021-08-05 IMAGING — CR [HOSPITAL] SKULL
1 series · 2 of 2 positions shown · non-contrast
Comparison: None.

HISTORY/INDICATION:  MRI clearance. Prior history of surgery.
TECHNIQUE: Skull, 2 views, no charge.

[Series 1: PA · 0.17mm/px · 2 of 2 slices shown]
[im 1/2]
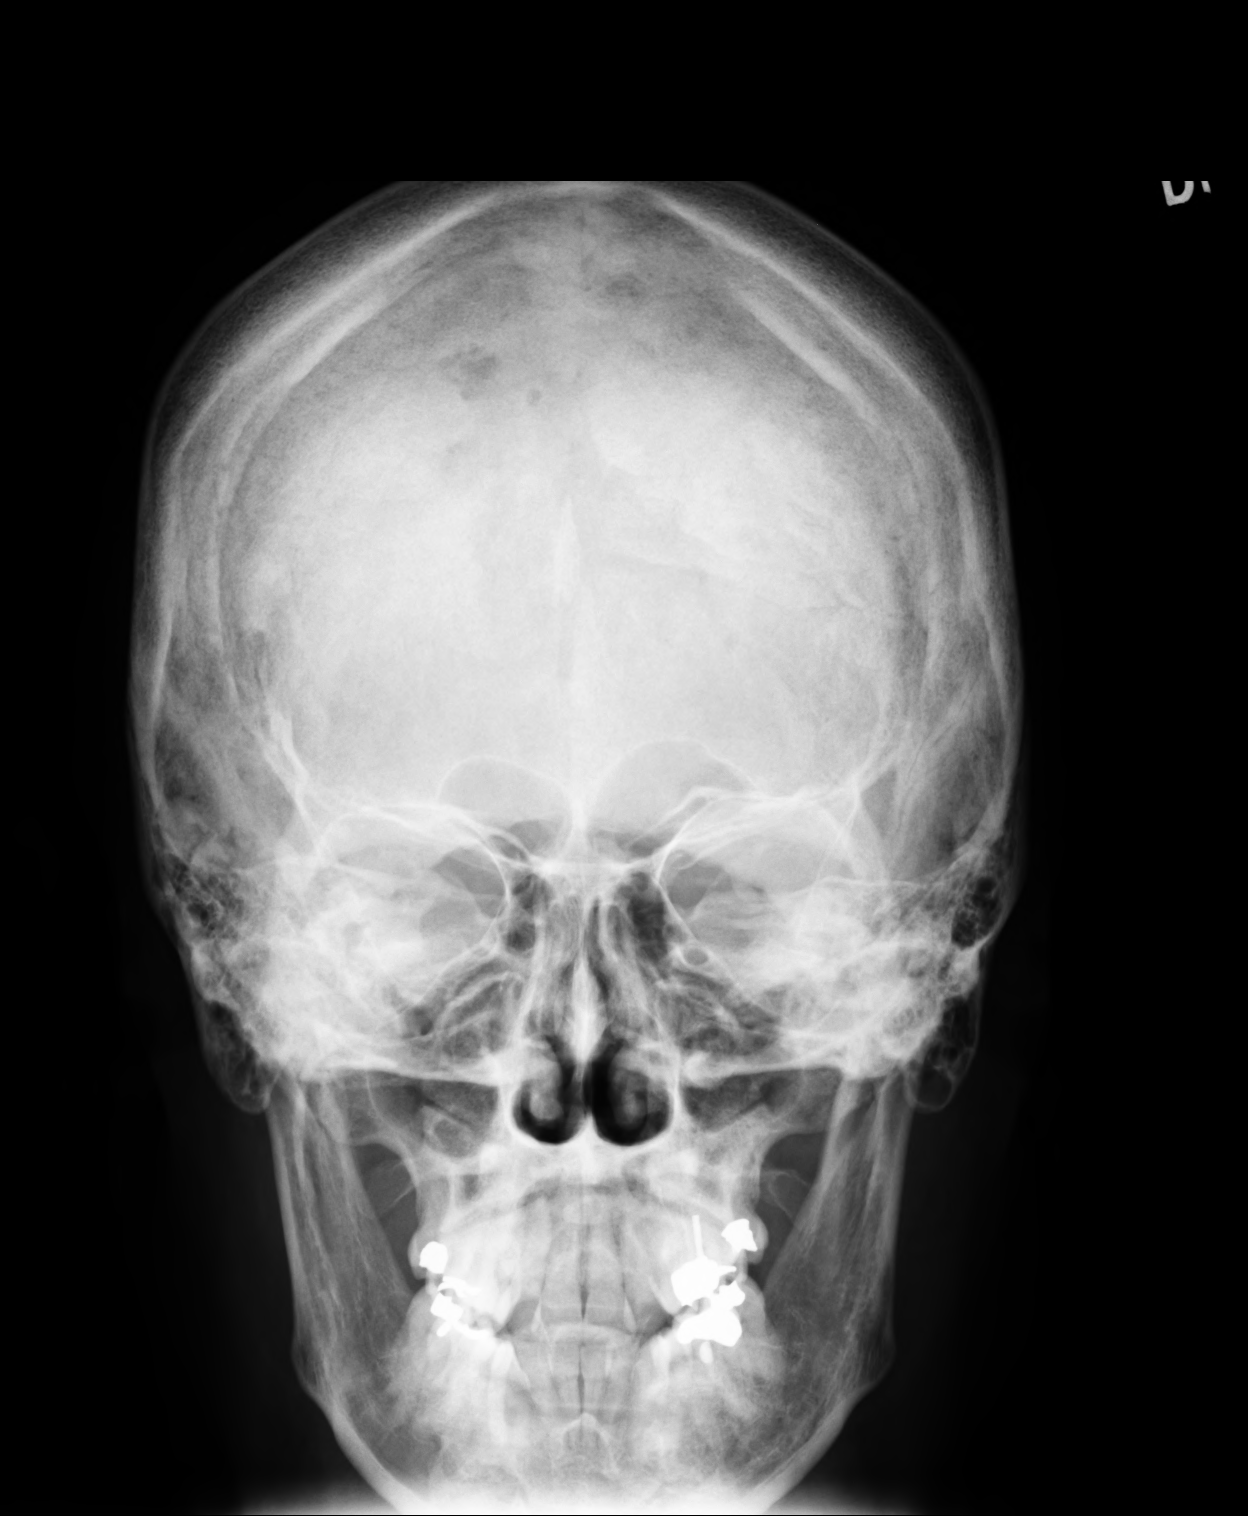
[im 2/2]
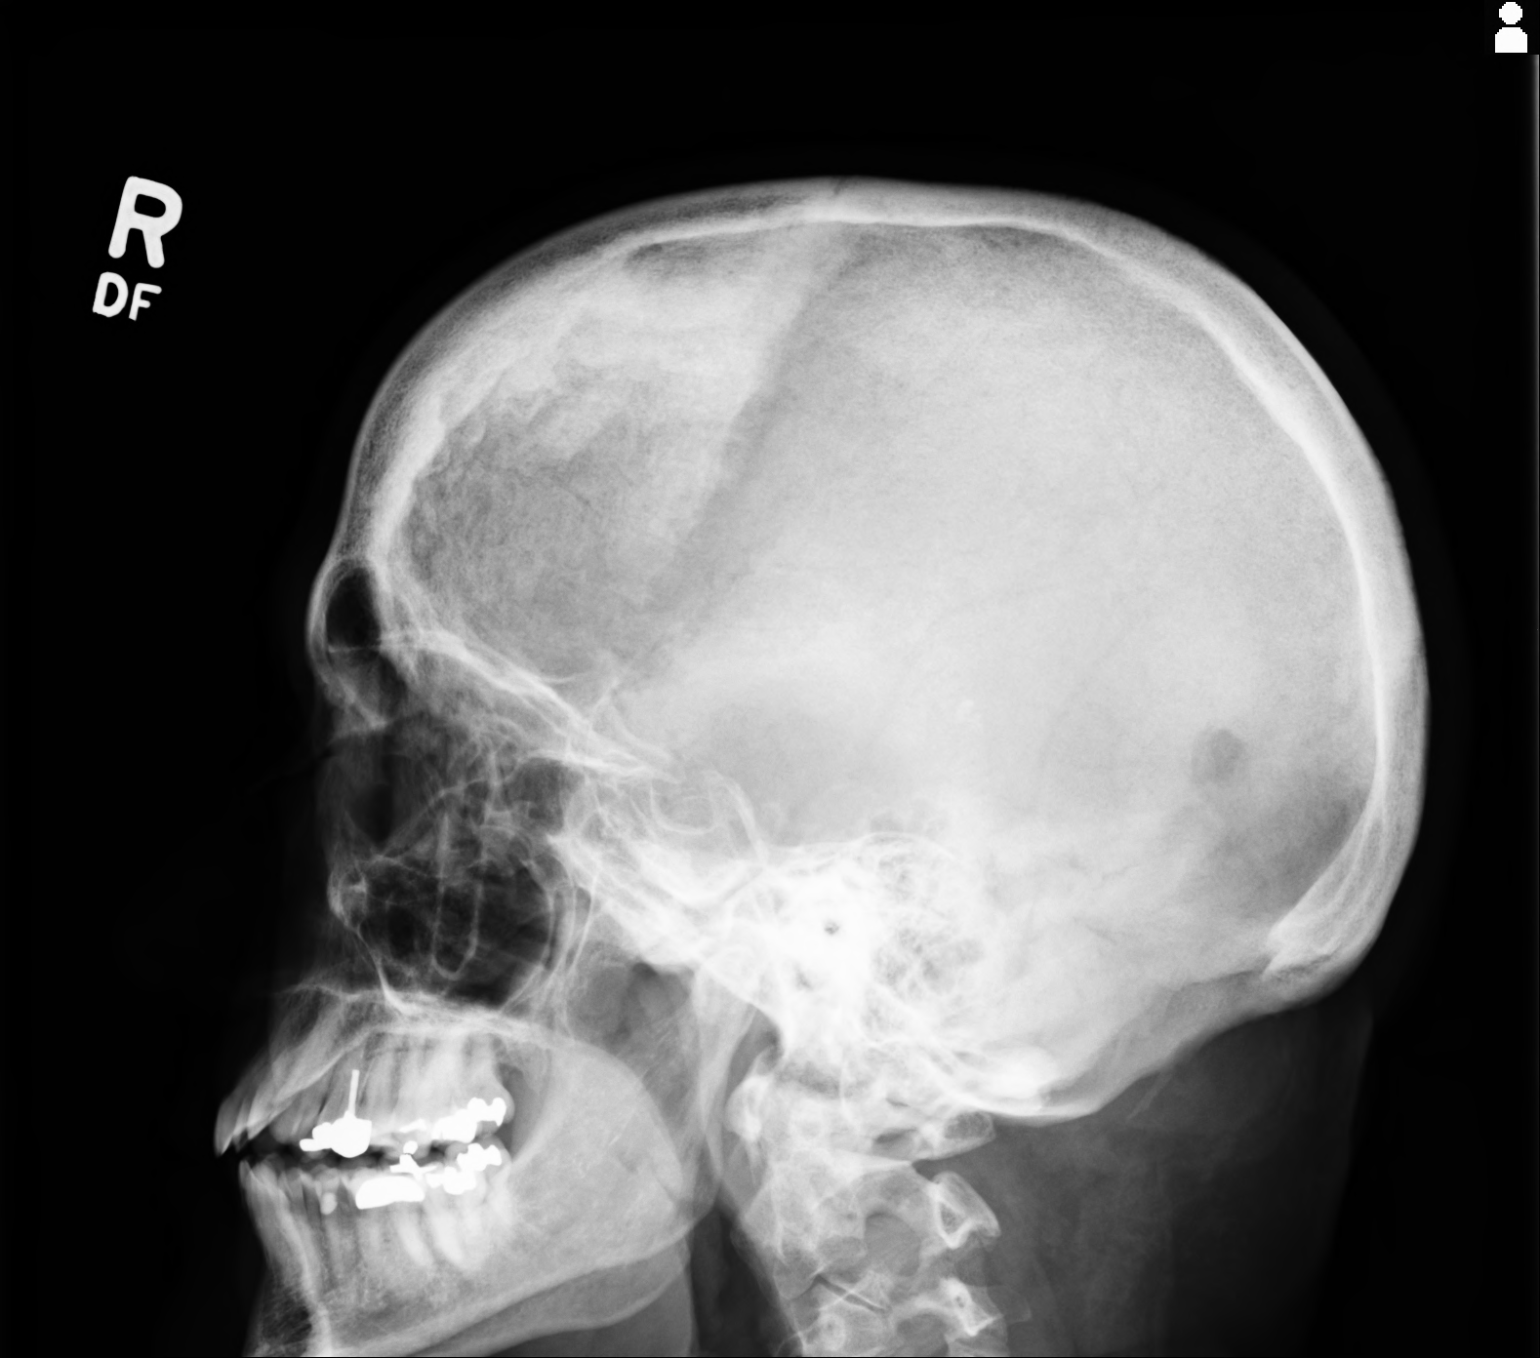

[2 of 2 positions shown; findings below may reference images not displayed]

FINDINGS: Hyperostosis frontalis interna is seen in the frontal bone. There is a small craniotomy defect in the right parieto-occipital suture with a larger craniotomy defect in the lower occipital bone. Several calcifications are seen in the midline contained in the pineal gland. There are no aneurysm surgical clips, or other contraindications to MR.
IMPRESSION: Postsurgical changes with no contraindications to MR.

STAT Fax

## 2021-12-14 NOTE — Telephone Encounter (Signed)
From: Meryl Dare D  To: Anvita Tanesia Butner  Sent: 12/13/2021 11:14 AM EDT  Subject: colonoscopy    Good morning, I see that you are overdue for a colonoscopy. Did you have one done recently, if not we can send in a referral for you to go get a colonoscopy done?

## 2022-02-22 IMAGING — CR CHEST 2 VWS PA LAT
1 series · 2 of 2 positions shown · non-contrast
Comparison: 04/24/20

Images Obtained from [facility]
HISTORY: 61 year old Female with acute upper respiratory infection, unspecified
TECHNIQUE: 2 view radiograph of the chest.

[Series 1: PA · 0.17mm/px · 2 of 2 slices shown]
[im 1/2]
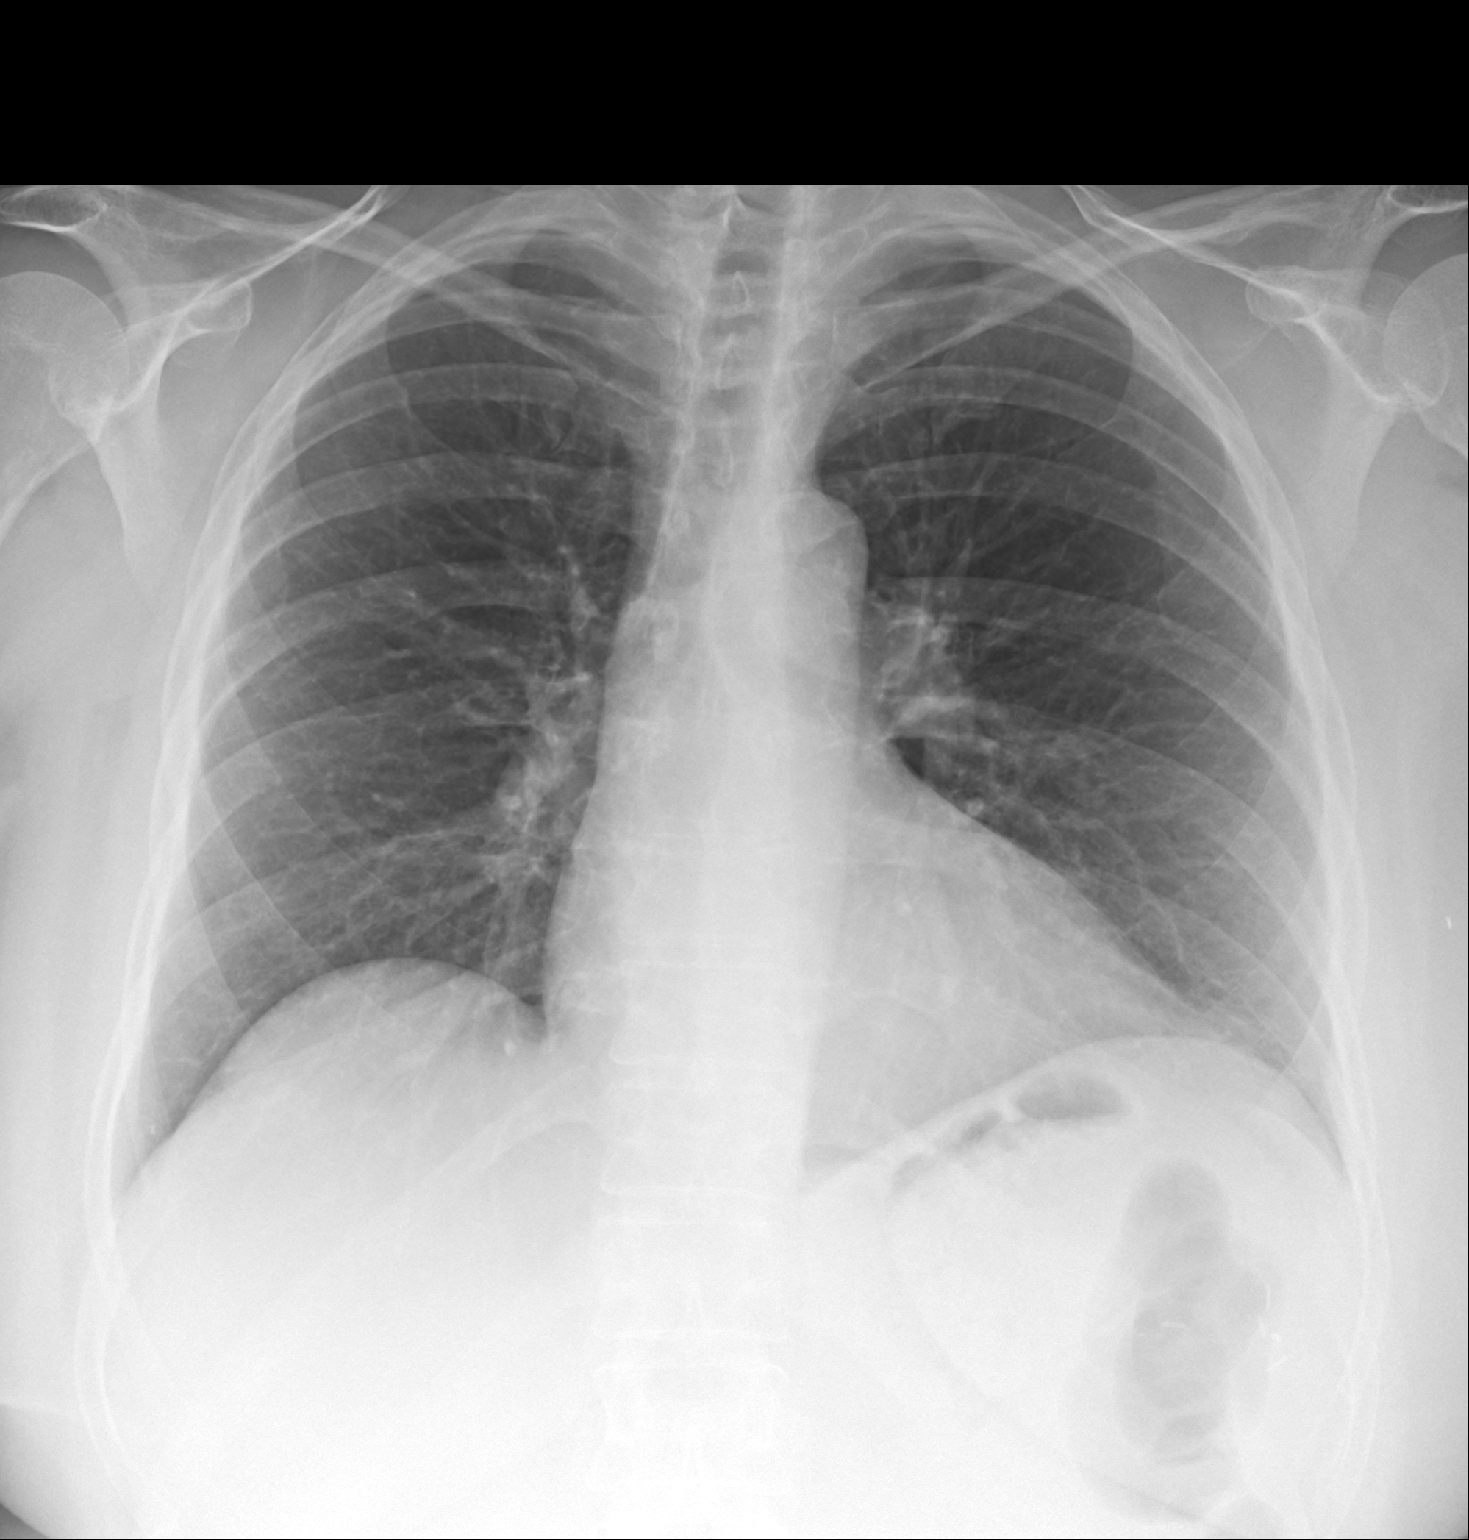
[im 2/2]
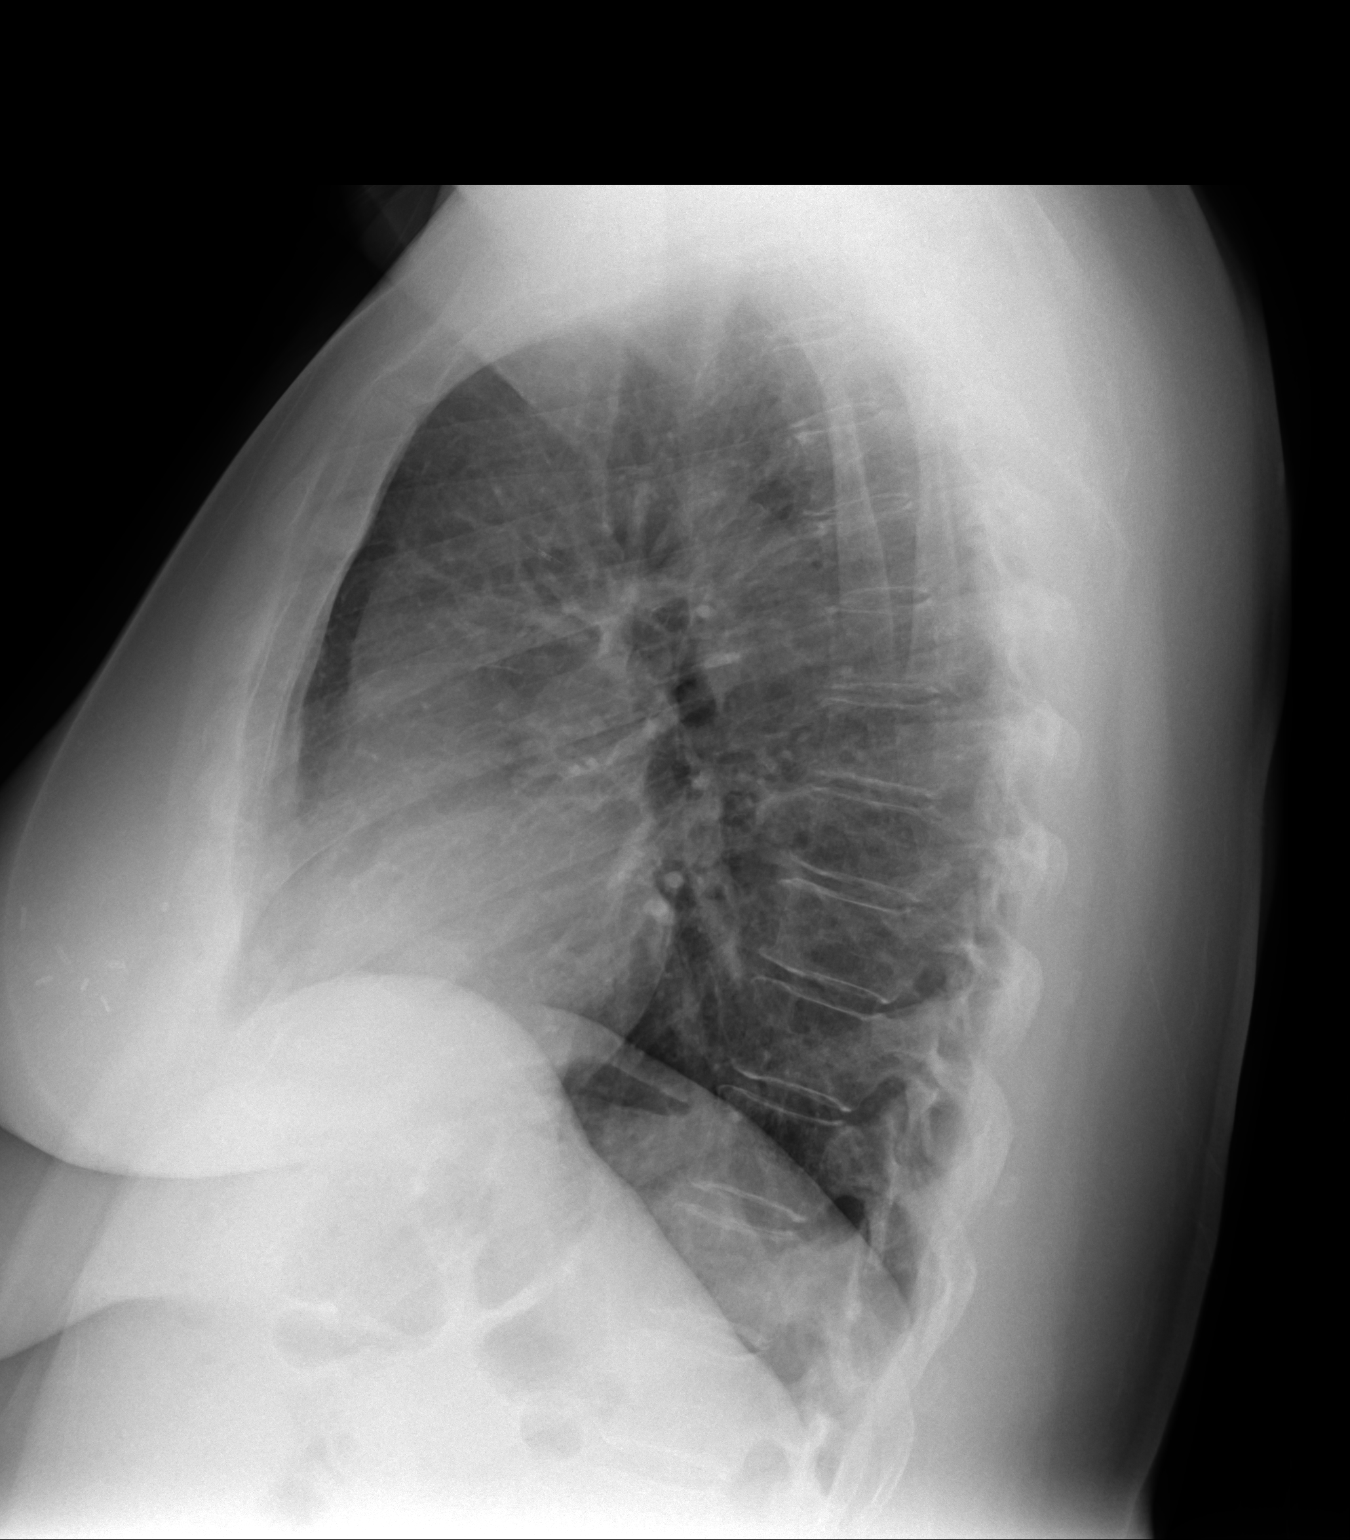

[2 of 2 positions shown; findings below may reference images not displayed]

FINDINGS: Normal heart size. The lungs are clear. No effusion. Left breast clips in the interim.
IMPRESSION: Stable chest

## 2022-04-06 IMAGING — CR L-SPINE 2-3 VWS
1 series · 3 of 3 positions shown · non-contrast
Comparison: None

Images Obtained from [facility]
Lumbar spine radiographs, 3 views
INDICATION: Pain

[Series 1: AP · 0.17mm/px · 3 of 3 slices shown]
[im 1/3]
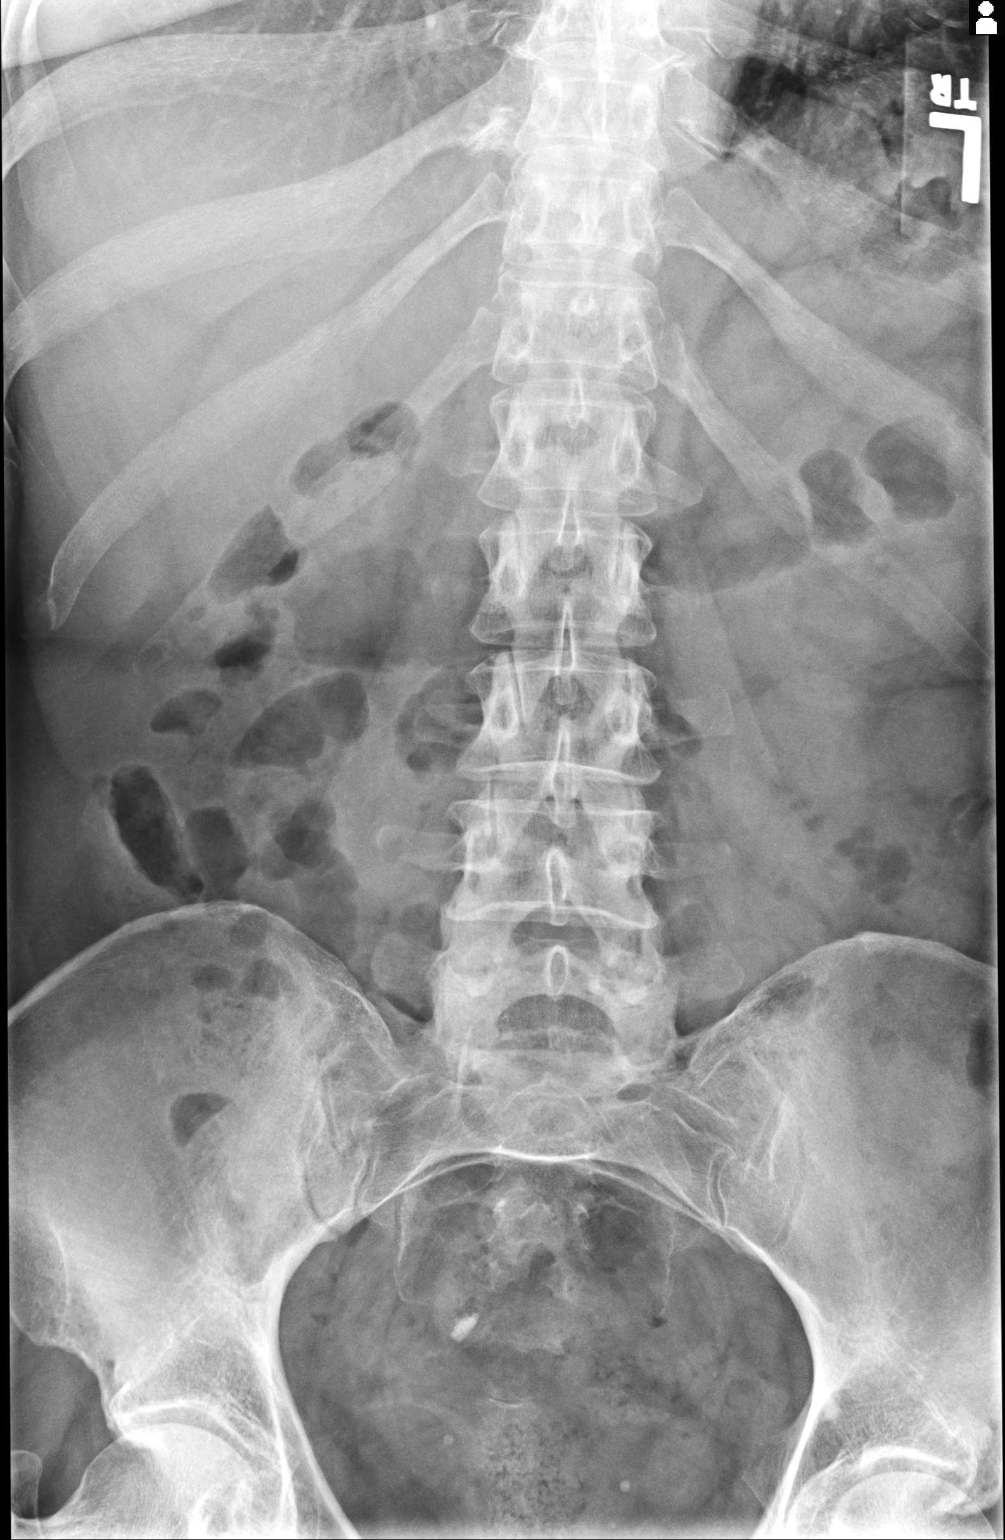
[im 2/3]
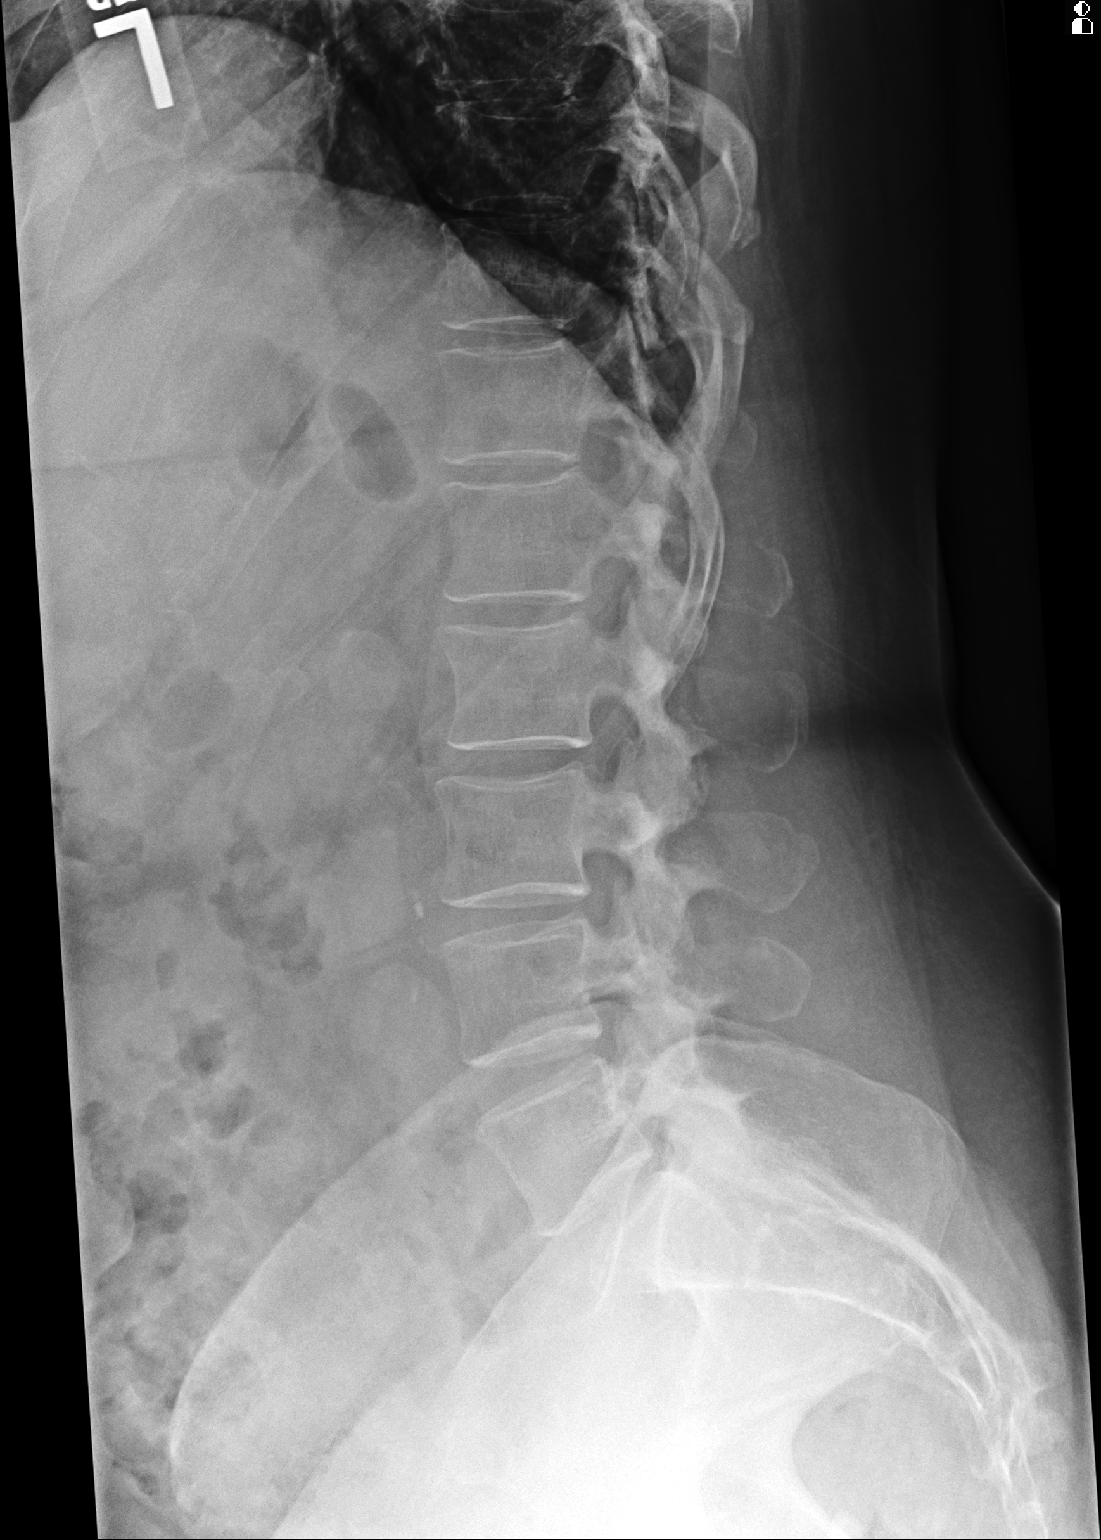
[im 3/3]
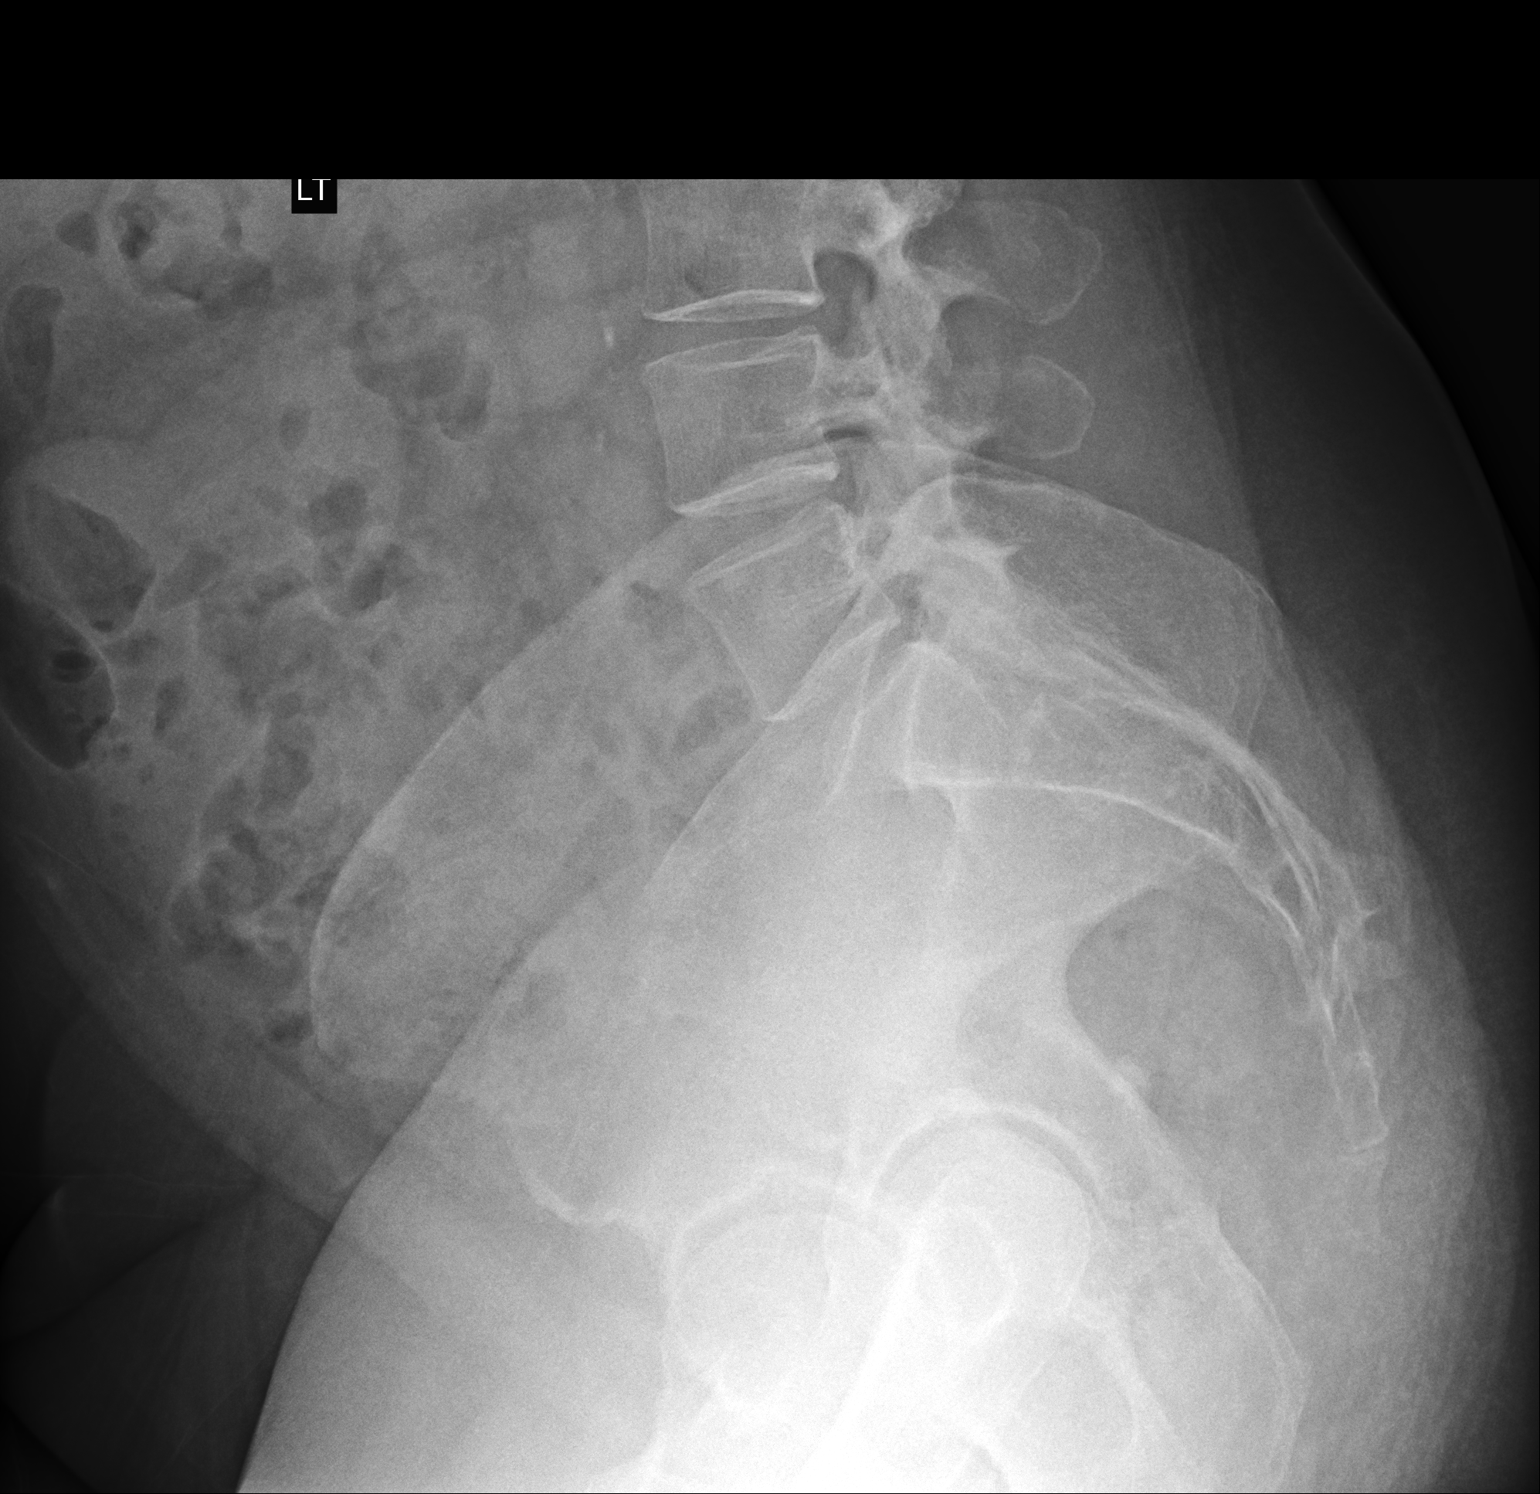

[3 of 3 positions shown; findings below may reference images not displayed]

FINDINGS: There are 5 lumbar type vertebra. There is straightening of the normal lumbar lordosis which may be positional or due to muscle spasm. Mild bilateral facet arthropathy seen at the mid to lower lumbar
spine. No anterior or posterior listhesis is seen. Vertebral body height, intervertebral disc spaces and posterior vertebral elements are otherwise normal. Mild calcification of the abdominal aorta
is seen in keeping with atherosclerotic vascular disease. Mild bilateral degenerative changes of the hips are seen. Small calcific densities seen in the right pelvis of uncertain etiology. If needed
consider further evaluation with CT of the pelvis without contrast.
IMPRESSION: 1.  Straightening of the normal lumbar lordosis which may be positional or due to muscle spasm.
2.  Mild bilateral facet arthropathy of the mid to lower lumbar spine.
3.  Mild atherosclerotic vascular disease of the abdominal aorta.

## 2022-04-30 IMAGING — CT CT BONY PELVIS WO CONTRAST
2 of 3 series · 11 of 46 positions shown, 12 images · IV contrast (agent unspecified)
Comparison: Lumbar spine study dated 04/06/2022.

Images Obtained from [facility]
HISTORY: 61 years-old Female with Abnormal findings on diagnostic imaging.
TECHNIQUE: Noncontrast enhanced helical CT acquisition performed from the top of the iliac crests to the pubic symphysis with coronal and sagittal reformats. Dose reduction technique used: Automated
exposure control and adjustment of the mA and/or kV according to patient size.
CT Studies and Cardiac Nuclear Medicine Studies in last 12-months = 0
IV contrast: None.
Oral contrast: None.
Total radiation dose to patient is CTDIvol 13.30 mGy and DLP 370.00 mGy-cm.

[Series 2: soft tissue · axial · 0.57mm/px · z∈[-1548,-1335]mm · 8 of 83 slices shown, 9 images]
[im 6/83  soft-tissue]
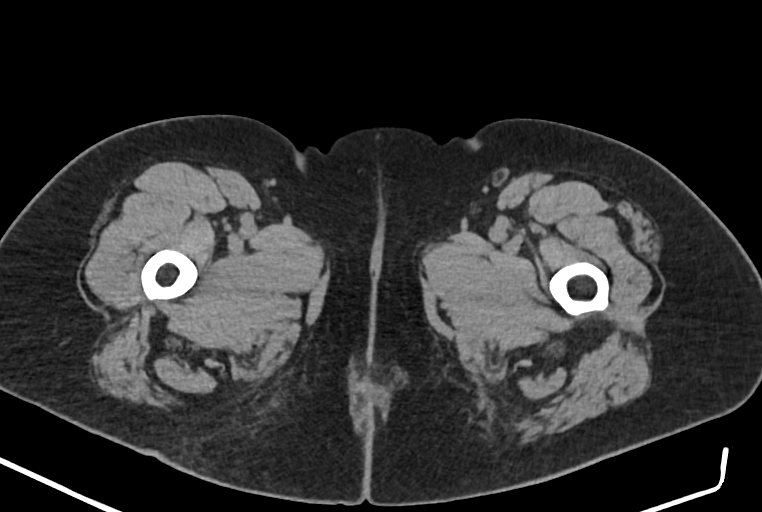
[im 6/83  bone]
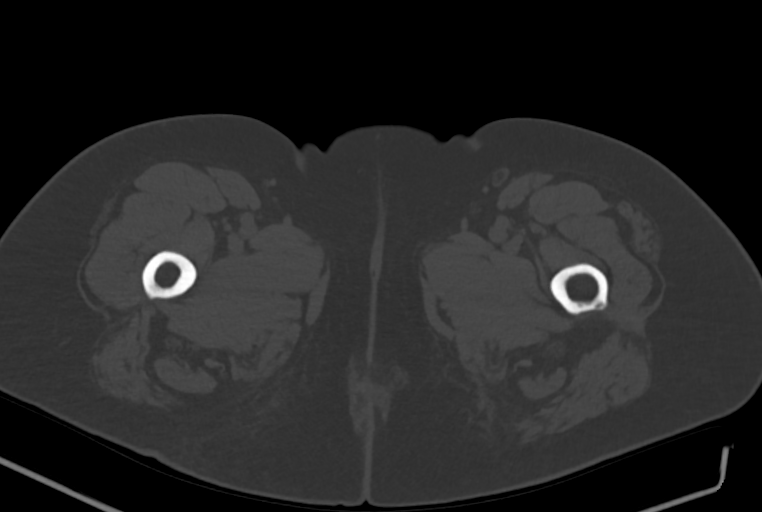
[im 16/83  soft-tissue]
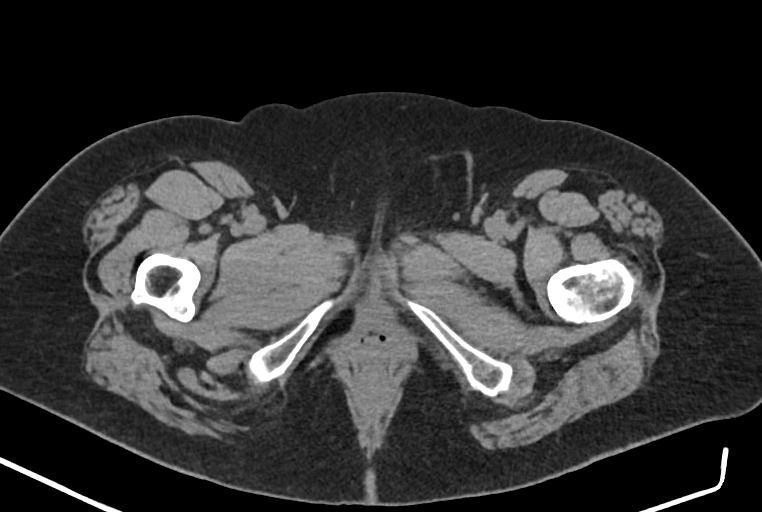
[im 27/83  soft-tissue]
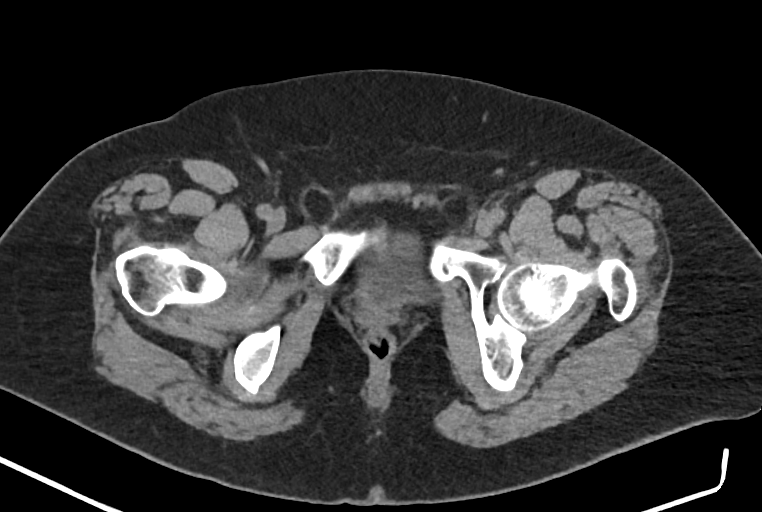
[im 38/83  soft-tissue]
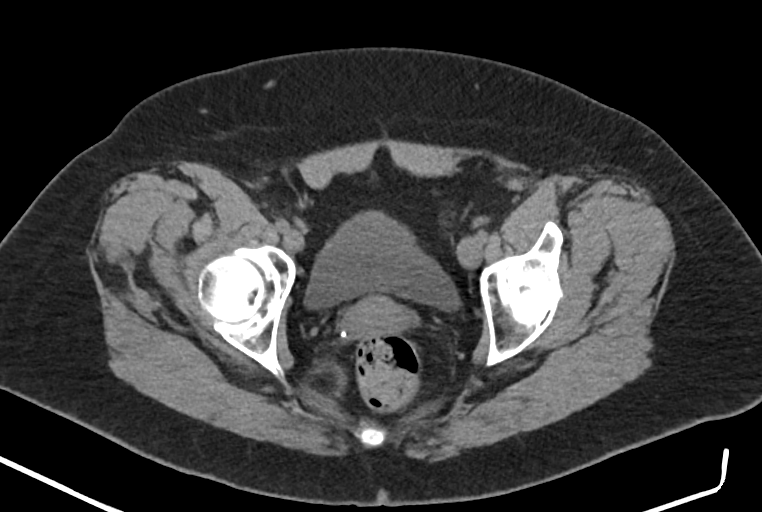
[im 45/83  soft-tissue]
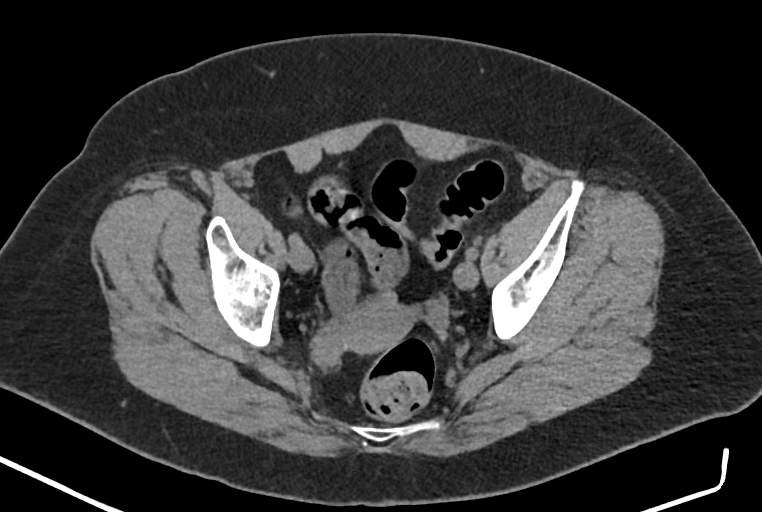
[im 56/83  soft-tissue]
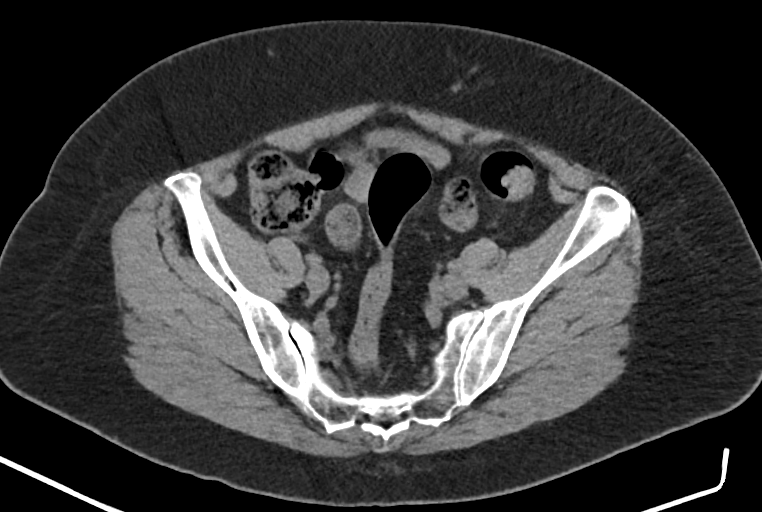
[im 67/83  soft-tissue]
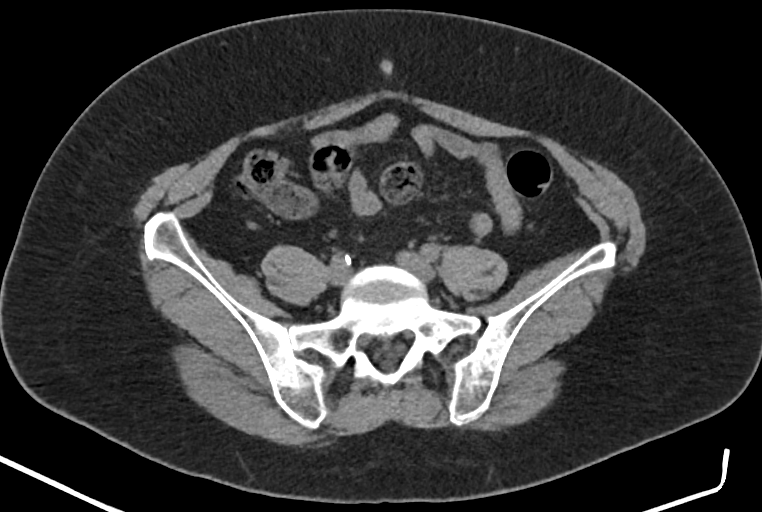
[im 77/83  soft-tissue]
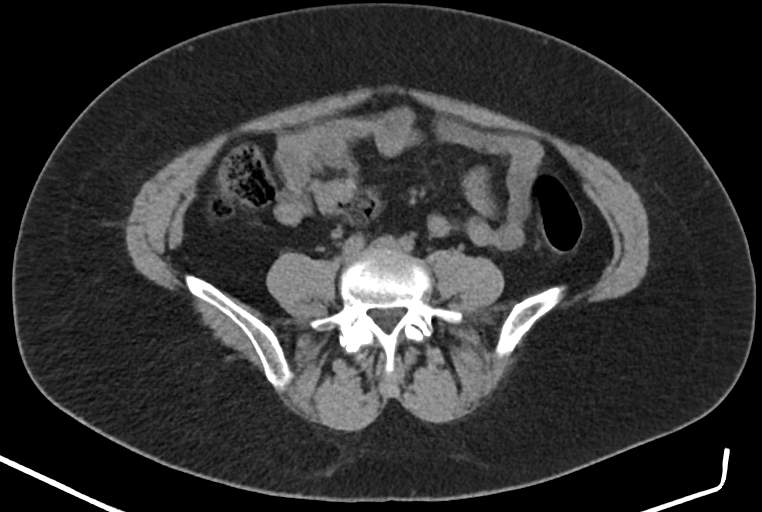

[Series 6: coronal · coronal · 0.49mm/px · 3 of 90 slices shown]
[im 30/90  soft-tissue]
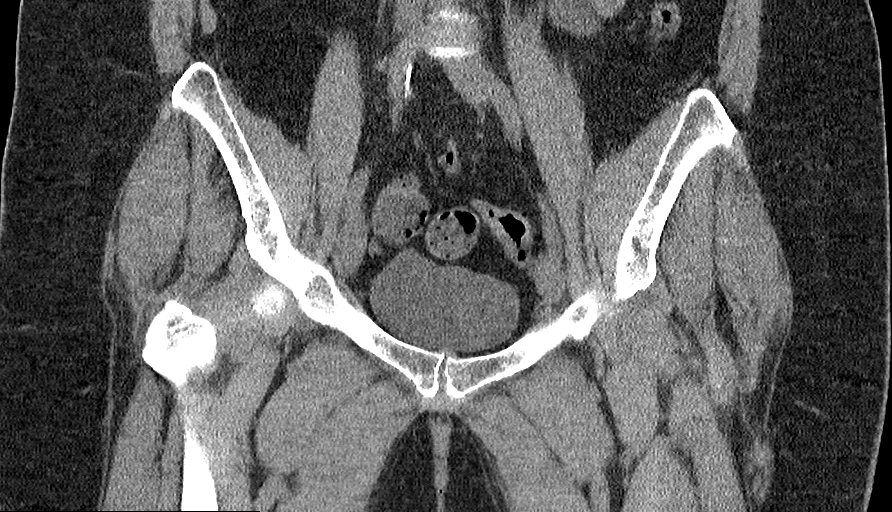
[im 40/90  soft-tissue]
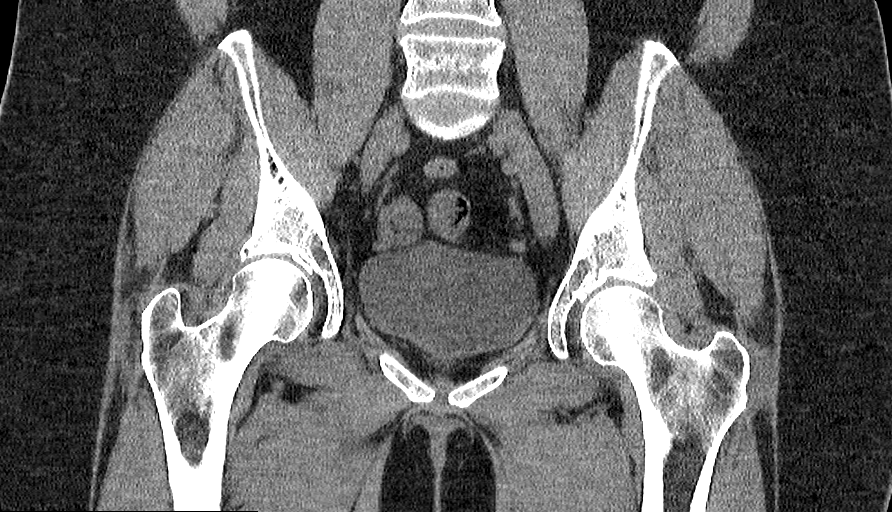
[im 50/90  soft-tissue]
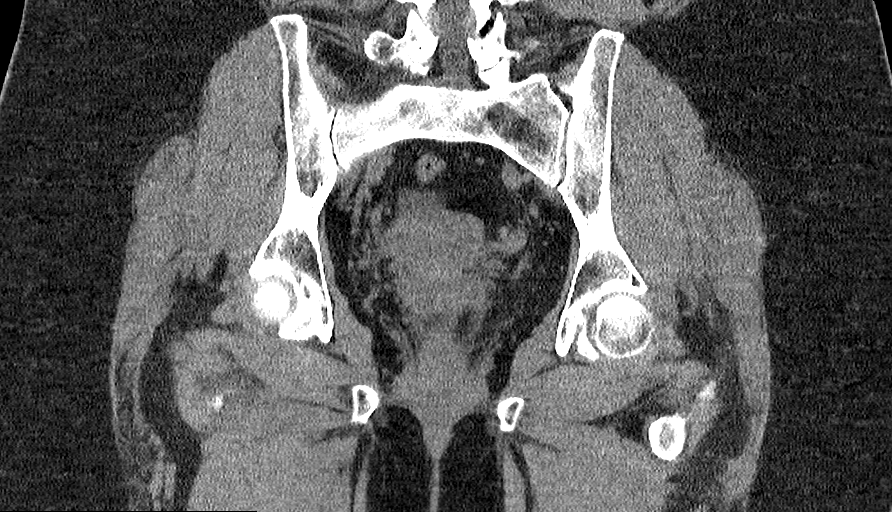

[11 of 46 positions shown; findings below may reference images not displayed]

FINDINGS: Pelvis: 1 cm calcification identified in the right posterior uterus, representing calcified uterine fibroid. This is benign. No evidence pelvic mass or lymphadenopathy. The bowel is unremarkable.
Bones and soft tissue: No lytic or blastic lesions. No acute fractures or subluxation. No significant degenerative changes.
IMPRESSION: 1.  1 cm calcified uterine fibroid.
2.  No acute finding in the pelvis.

## 2022-08-31 ENCOUNTER — Encounter

## 2022-12-07 IMAGING — CR [HOSPITAL] SKULL
1 series · 2 of 2 positions shown · non-contrast
Comparison: None

Images Obtained from [facility]
HISTORY/INDICATIONS:   Rule out metal foreign body prior to MRI.
TECHNIQUE: Skull 2 views

[Series 1: PA · 0.17mm/px · 2 of 2 slices shown]
[im 1/2]
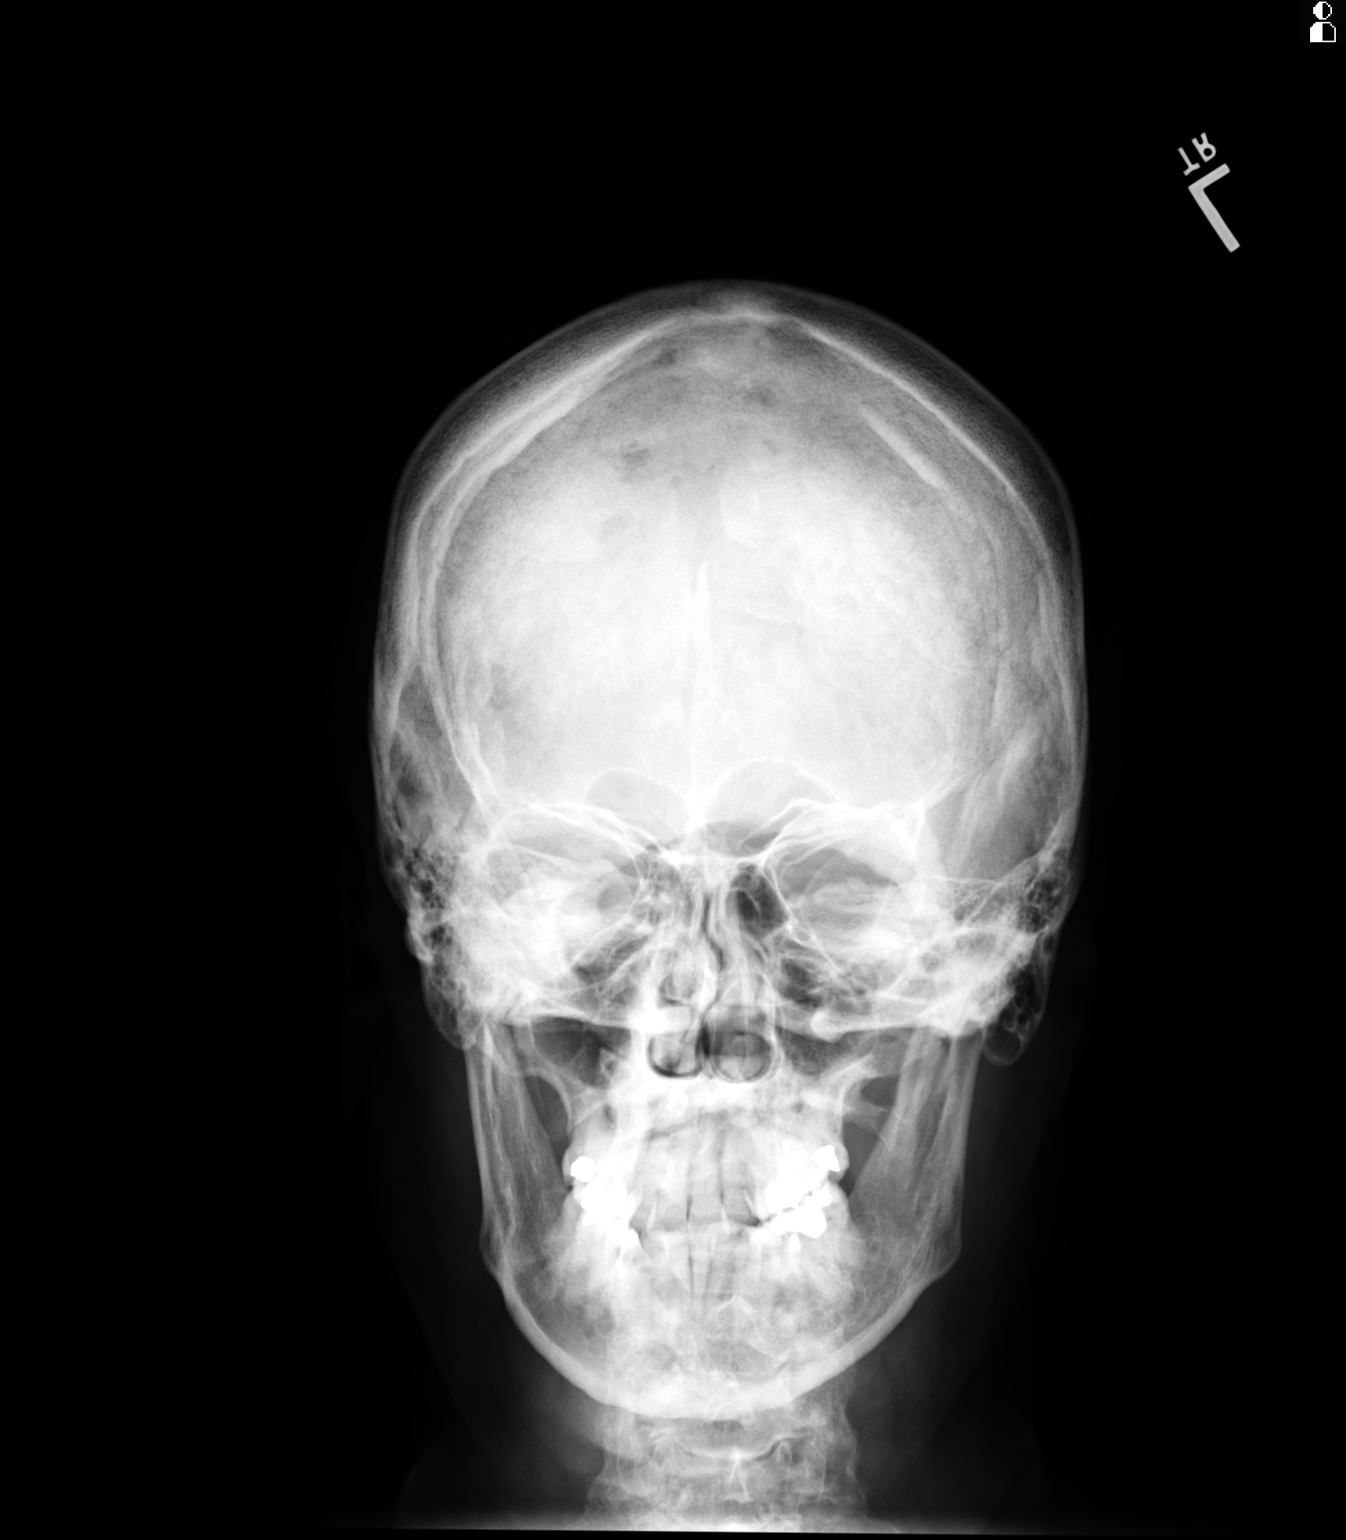
[im 2/2]
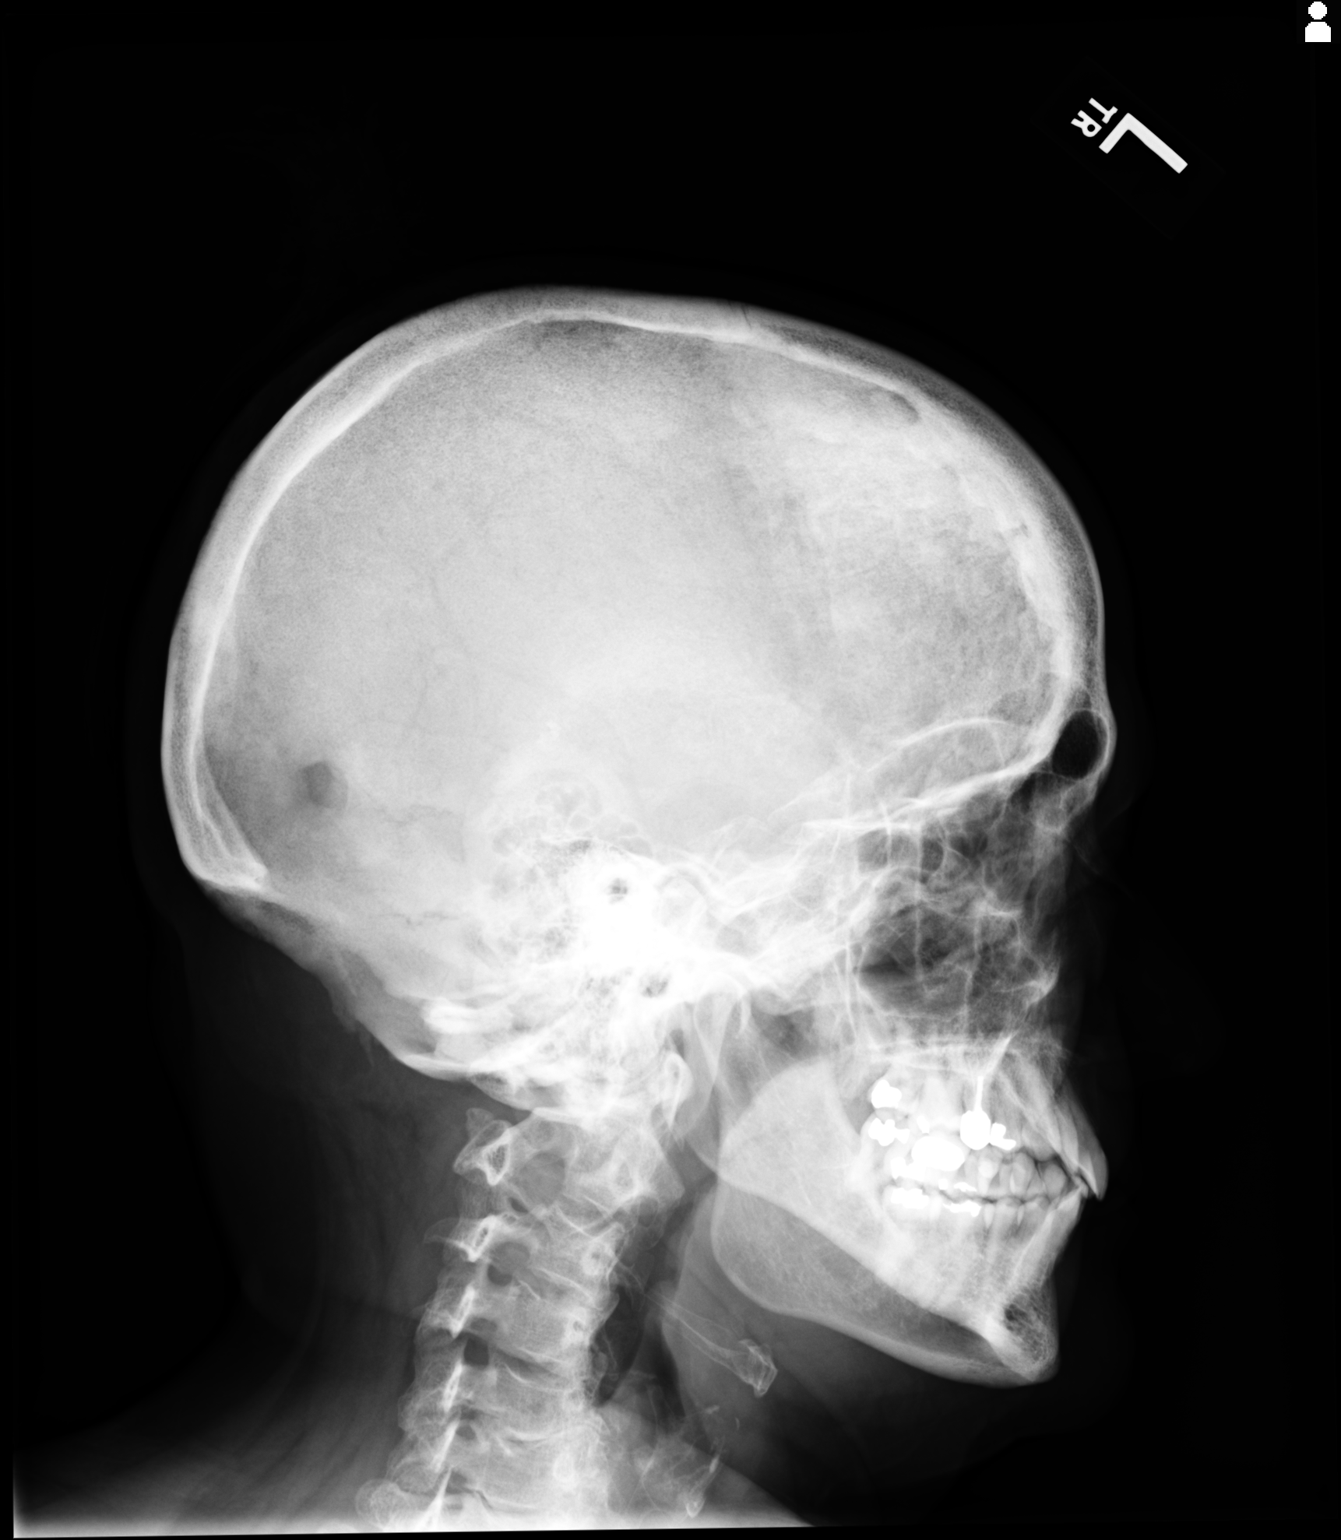

[2 of 2 positions shown; findings below may reference images not displayed]

FINDINGS: No radiopaque foreign object to preclude MRI scanning.
IMPRESSION: The patient may undergo MR scanning.

## 2022-12-07 IMAGING — MR MRI SHOULDER RT WO CONTRAST
4 series · 16 of 40 positions shown · non-contrast
Comparison: None available.

Images Obtained from [facility]
INDICATION: Strain of unspecified muscle, fascia and tendon at shoulder and upper arm level, right arm, initial encounter.
TECHNIQUE: Multiplanar, multisequence MR imaging of the right shoulder without contrast.

[Series 5: t2_axial_fs · axial · right · 3.0mm · 0.26mm/px · z∈[+1,+61]mm · 7 of 20 slices shown]
[im 1/20]
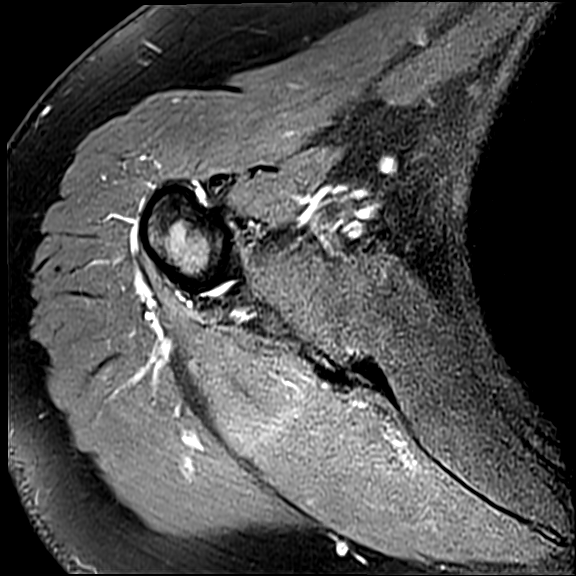
[im 3/20]
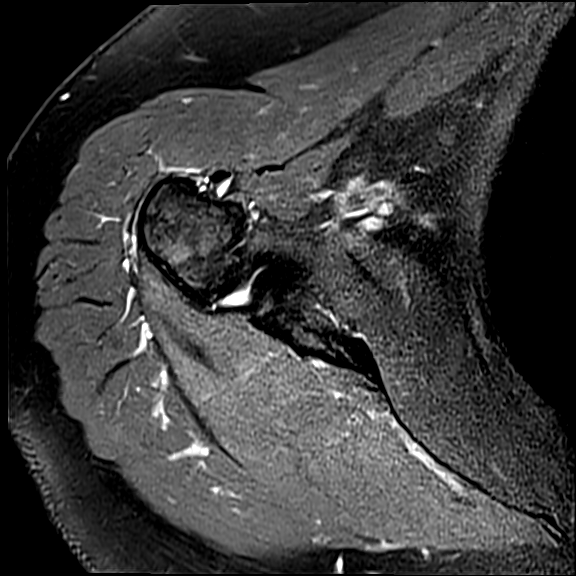
[im 6/20]
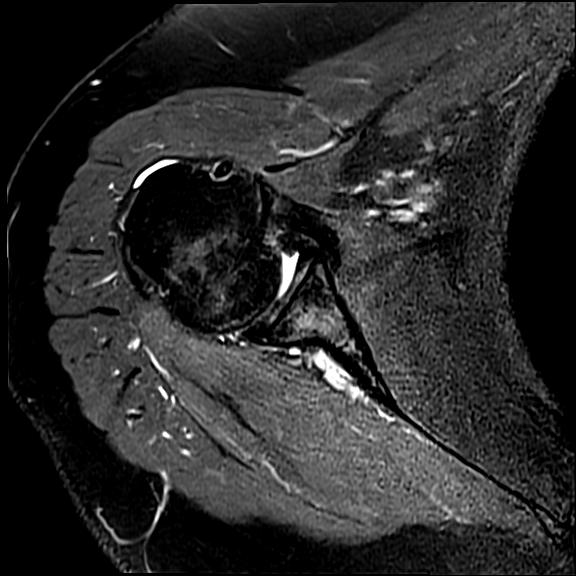
[im 9/20]
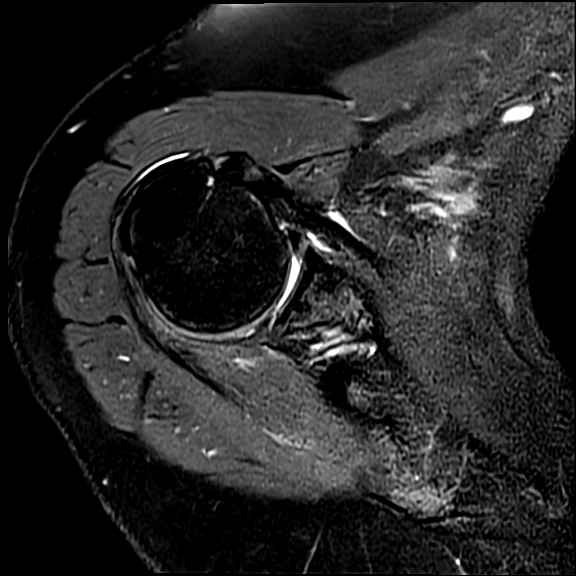
[im 11/20]
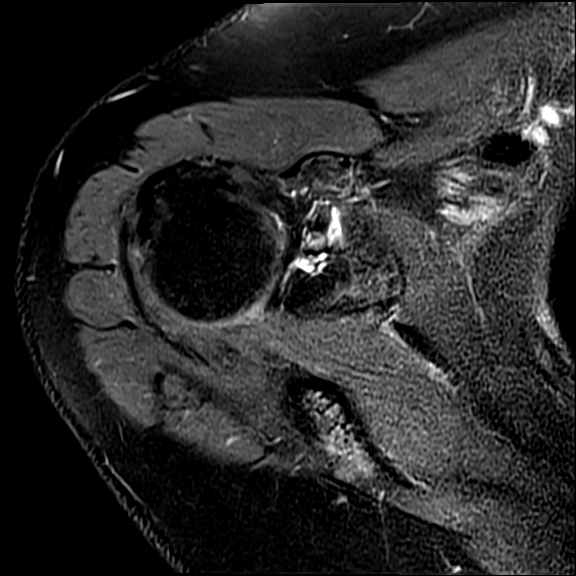
[im 14/20]
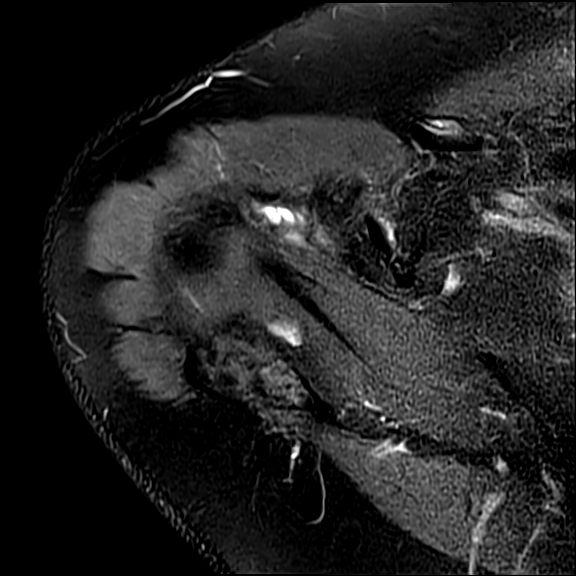
[im 17/20]
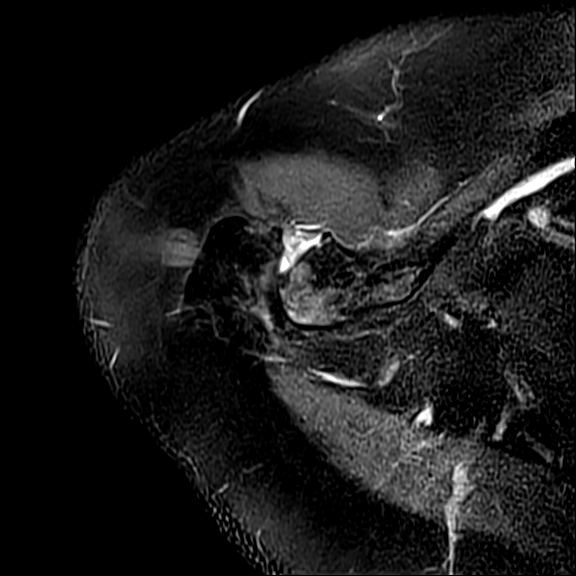

[Series 6: t2_cor_obl_fs · oblique · right · 3.0mm · 0.25mm/px · 3 of 18 slices shown]
[im 3/18]
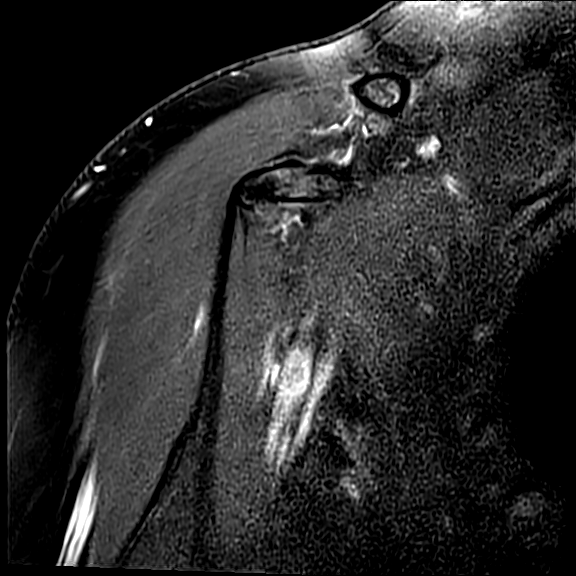
[im 10/18]
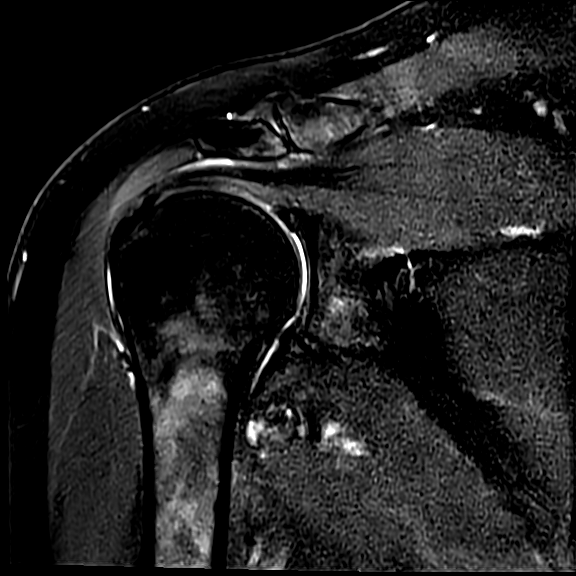
[im 15/18]
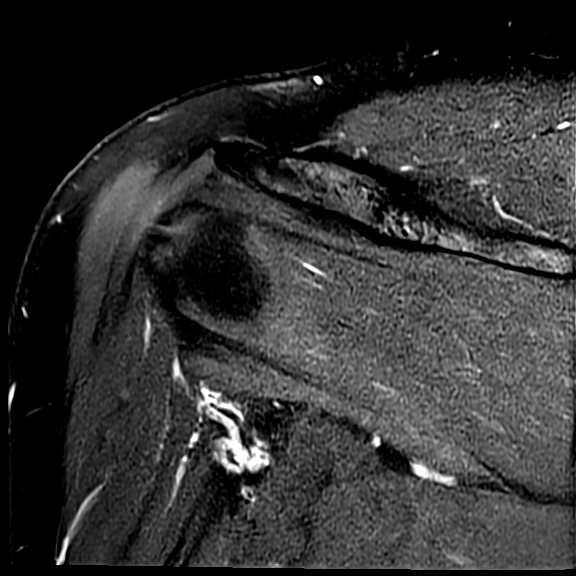

[Series 7: t2_sag_obl_fs · oblique · right · 3.0mm · 0.28mm/px · 3 of 28 slices shown]
[im 5/28]
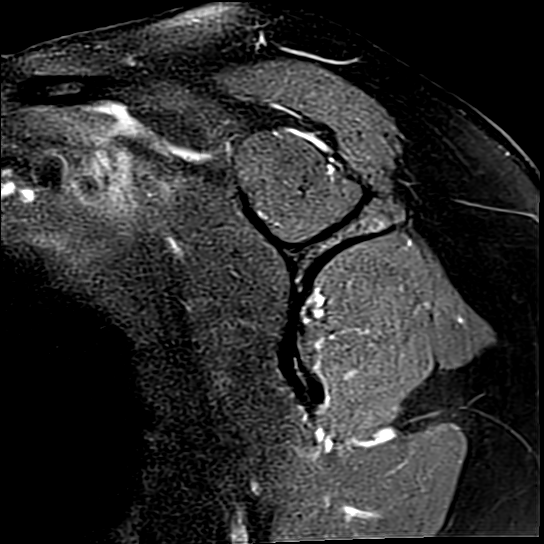
[im 15/28]
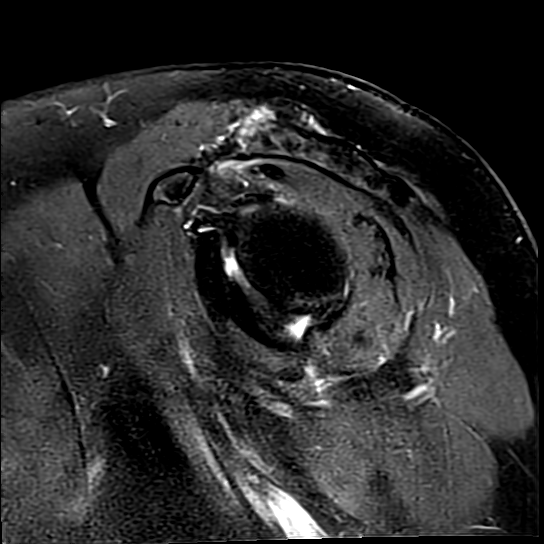
[im 25/28]
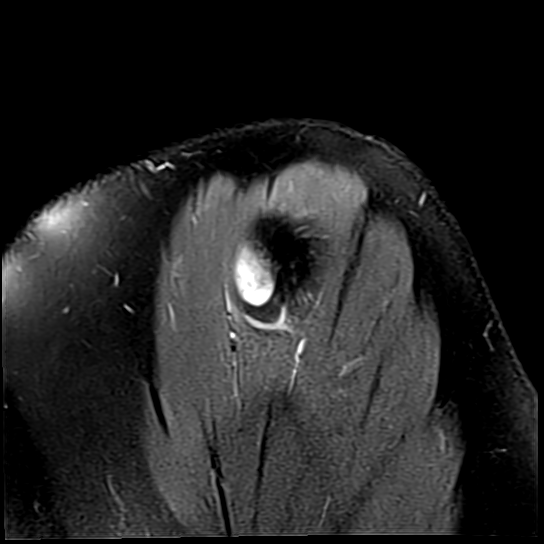

[Series 8: t1_sag_obl · oblique · right · 3.0mm · 0.26mm/px · 3 of 28 slices shown]
[im 5/28]
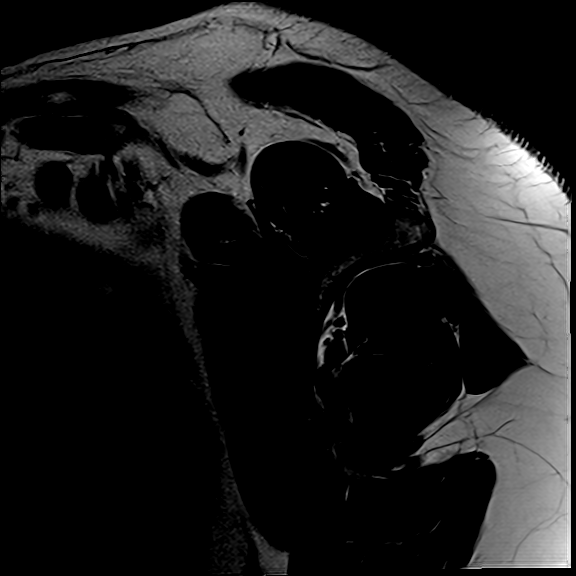
[im 15/28]
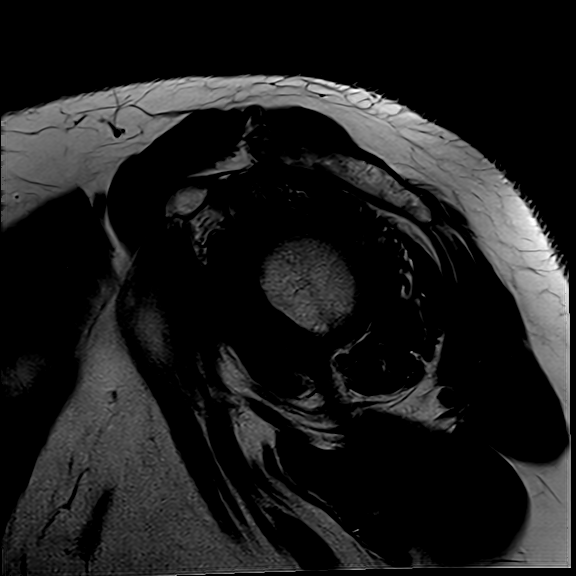
[im 25/28]
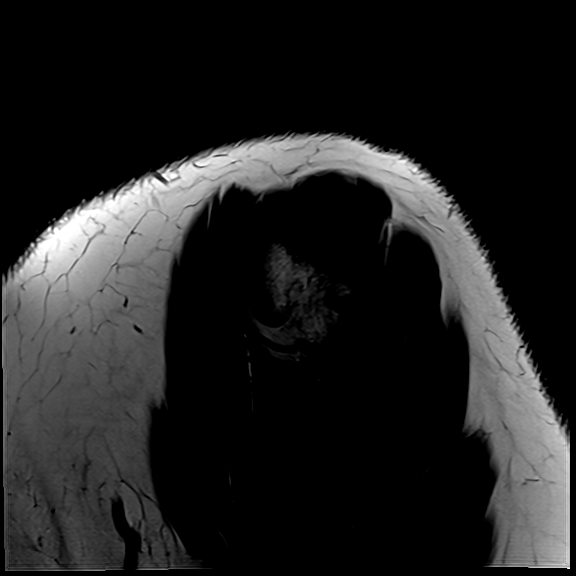

[16 of 40 positions shown; findings below may reference images not displayed]

FINDINGS: OSSEOUS: No acute fracture, avascular necrosis or aggressive osseous lesion.
ACROMIAL OUTLET:
Acromioclavicular joint osteoarthritis: Mild.
Acromion: Non-hooked.
Subacromial/subdeltoid bursa fluid: No significant fluid.
Coracoclavicular and coracoacromial ligaments: Intact.
ROTATOR CUFF TENDONS:
Supraspinatus and infraspinatus: Mild supraspinatus and infraspinatus tendinosis.
Teres minor: Intact.
Subscapularis: Intact.
ROTATOR CUFF MUSCLES: No fatty atrophy or intramuscular edema.
LABRUM: Tear of the superior and posterior superior labrum.
BICEPS TENDON: Mild proximal intra-articular long head biceps tendinosis. The proximal extra-articular long head biceps tendon is intact and normally located.
GLENOHUMERAL JOINT: The articular cartilage is maintained. Alignment is normal. No effusion.
OTHER: The inferior glenohumeral ligament is unremarkable.
IMPRESSION: 1.  Mild supraspinatus and infraspinatus tendinosis, without rotator cuff tear.
2.  Mild proximal intra-articular long head biceps tendinosis. Tear of the superior and posterior superior labrum.
3.  Mild acromioclavicular joint osteoarthritis.

## 2022-12-21 IMAGING — CT CT SHOULDER LT WO CONTRAST
3 of 4 series · 13 of 33 positions shown, 16 images · non-contrast
Comparison: MRI left shoulder study dated 12/07/2022.

Images Obtained from [facility]
INDICATION: Strain of unspecified muscle, fascia and tendon at shoulder and upper arm level, left arm, initial encounter
TECHNIQUE: Multiple axial CT images of the Shoulder were performed without contrast. Coronal and sagittal reformatted images were obtained and interpreted. Dose reduction technique used: Automated
exposure control and adjustment of the mA and/or kV according to patient size.
CT Studies and Cardiac Nuclear Medicine Studies in last 12-months = 1

[Series 6: coronal · coronal · 0.37mm/px · 3 of 69 slices shown]
[im 14/69  bone]
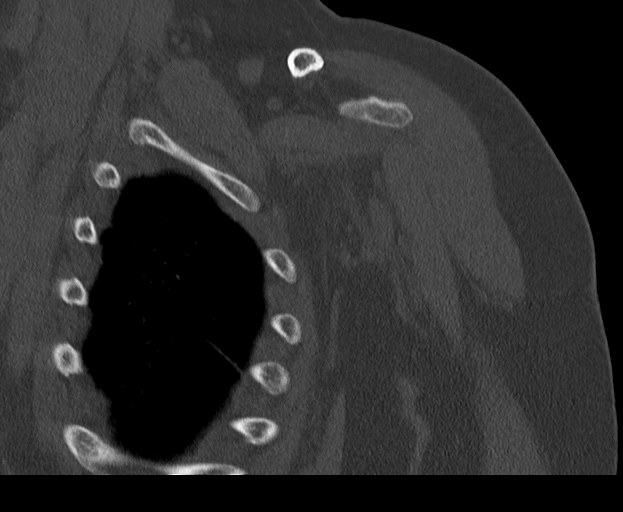
[im 28/69  bone]
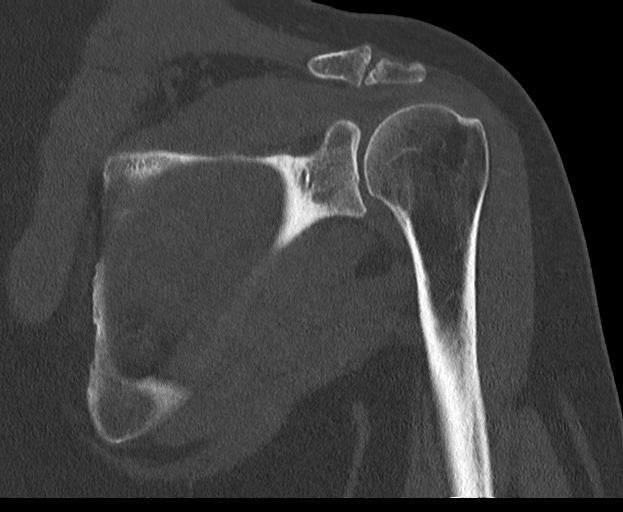
[im 41/69  bone]
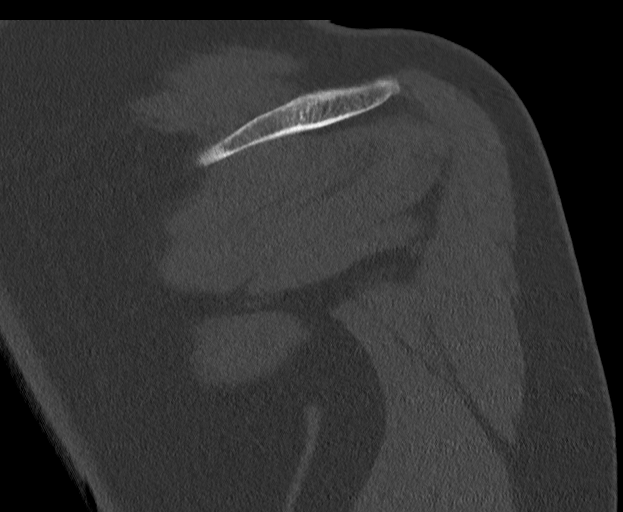

[Series 8: sagittal · sagittal · 0.27mm/px · 5 of 116 slices shown, 6 images]
[im 39/116  bone]
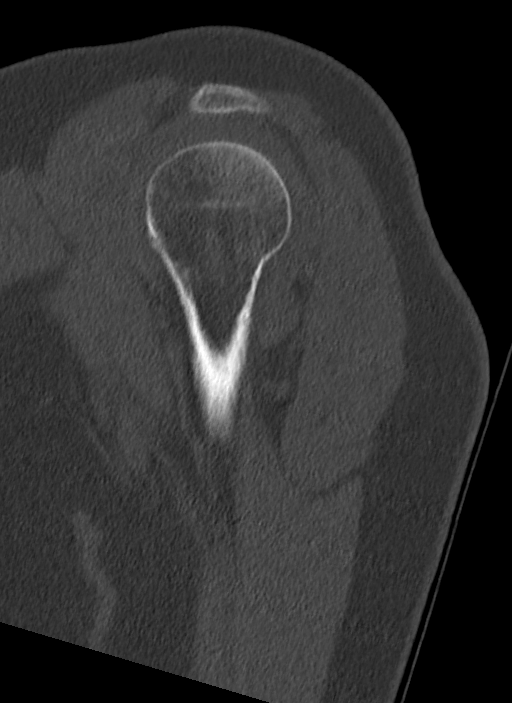
[im 48/116  bone]
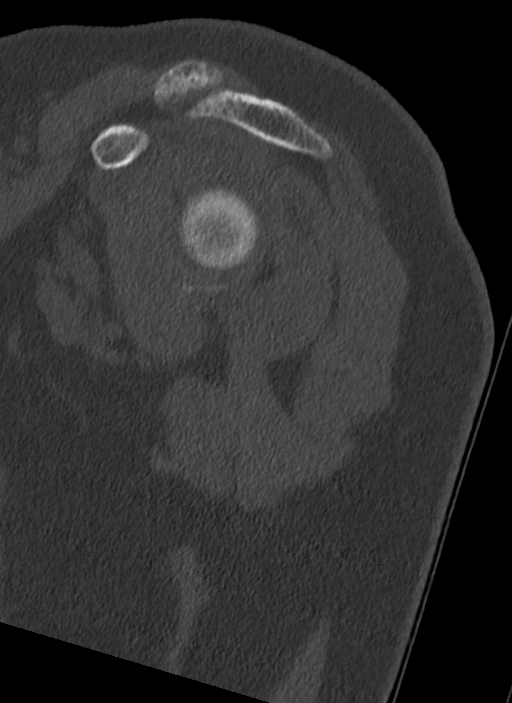
[im 58/116  soft-tissue]
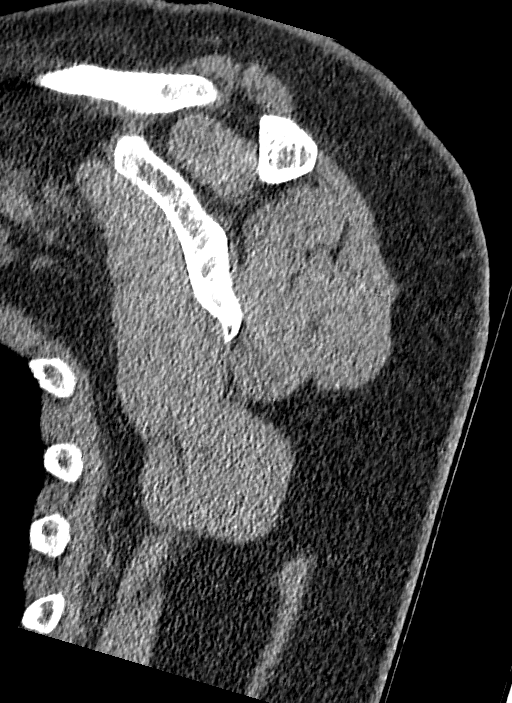
[im 58/116  bone]
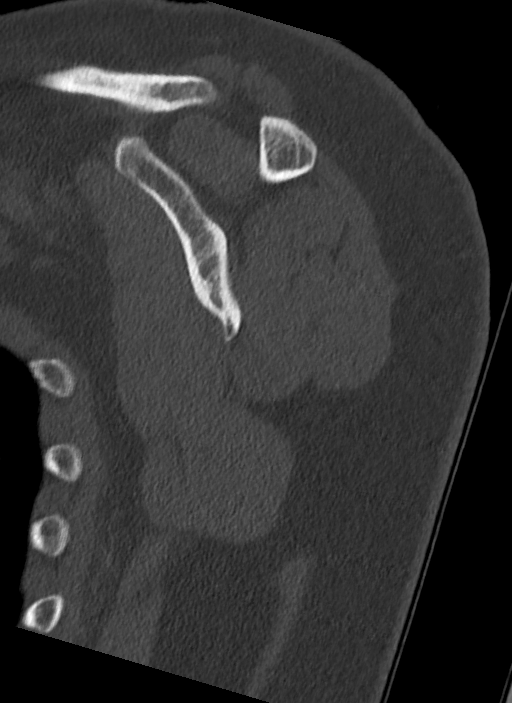
[im 68/116  bone]
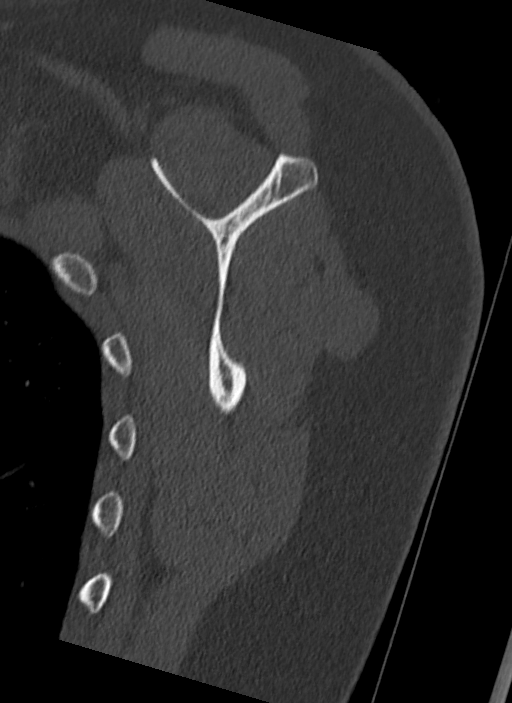
[im 77/116  bone]
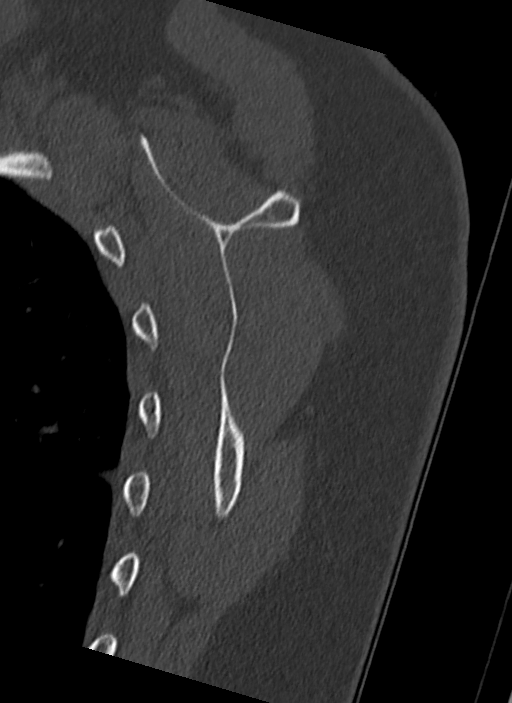

[Series 10: soft tissue thins · axial · 0.38mm/px · z∈[-1224,-1083]mm · 5 of 221 slices shown, 7 images]
[im 23/221  soft-tissue]
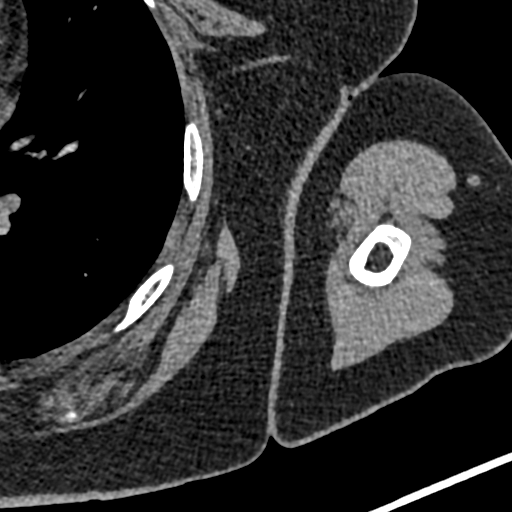
[im 23/221  bone]
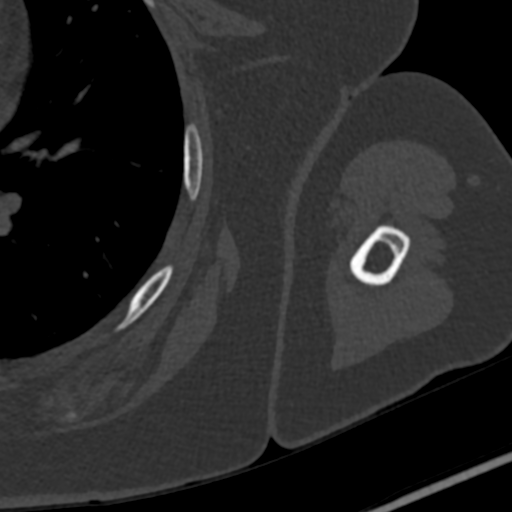
[im 67/221  bone]
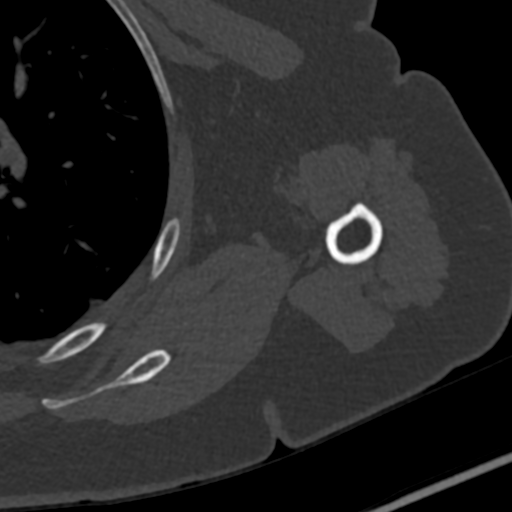
[im 111/221  bone]
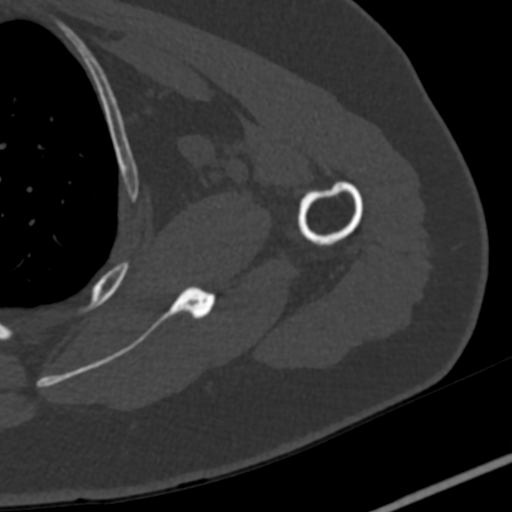
[im 155/221  bone]
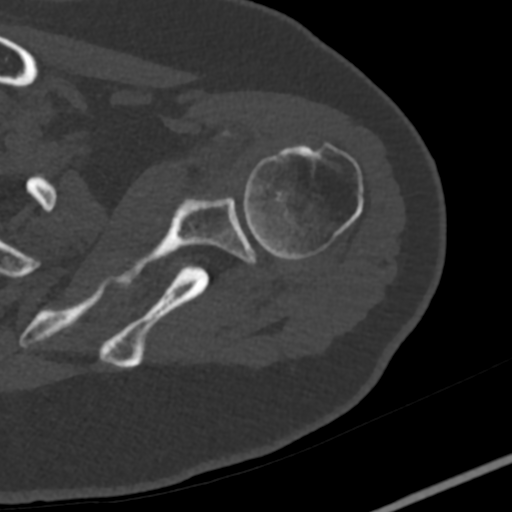
[im 199/221  soft-tissue]
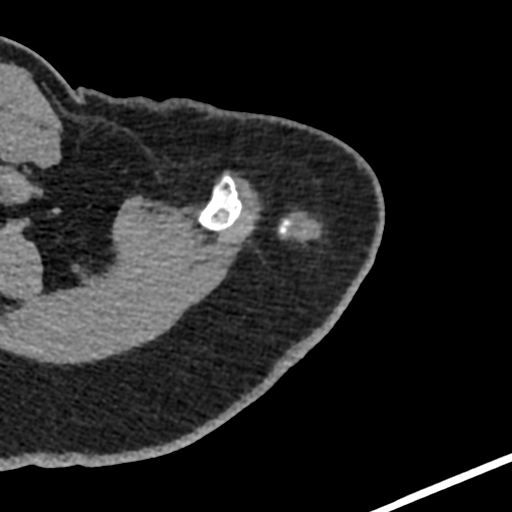
[im 199/221  bone]
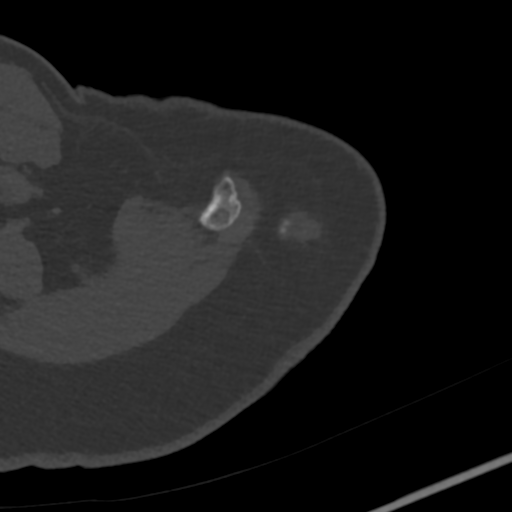

[13 of 33 positions shown; findings below may reference images not displayed]

FINDINGS: Healed bony Bankart deformity involving the anterior inferior glenoid rim, with a minimal joint offset measuring 1 to 2 mm. This is best demonstrated on axial images (series 4, images 31-37).
The remaining bones and soft tissues remain unchanged compared to prior examination.
Visualized left axilla, left ribs and the lungs are unremarkable.
IMPRESSION: 1.  Healed bony Bankart deformity involving the anterior inferior glenoid rim, with a minimal joint offset measuring 1 to 2 mm.
2.  The remaining bones and soft tissues are unremarkable.
3.  Findings correlate with MRI left shoulder study.
